# Patient Record
Sex: Male | Born: 1980 | Race: White | Hispanic: No | Marital: Single | Smoking: Former smoker
Health system: Southern US, Community
[De-identification: ages and names within clinical notes are randomized; demographics above are authoritative.]

---

## 2014-06-19 ENCOUNTER — Encounter: Payer: Self-pay | Admitting: Family Medicine

## 2014-06-19 ENCOUNTER — Ambulatory Visit (INDEPENDENT_AMBULATORY_CARE_PROVIDER_SITE_OTHER): Admitting: Family Medicine

## 2014-06-19 ENCOUNTER — Encounter (INDEPENDENT_AMBULATORY_CARE_PROVIDER_SITE_OTHER): Payer: Self-pay

## 2014-06-19 VITALS — BP 107/65 | HR 66 | Temp 97.3°F | Ht 70.0 in | Wt 165.0 lb

## 2014-06-19 DIAGNOSIS — Z720 Tobacco use: Secondary | ICD-10-CM

## 2014-06-19 DIAGNOSIS — M129 Arthropathy, unspecified: Secondary | ICD-10-CM | POA: Diagnosis not present

## 2014-06-19 DIAGNOSIS — F17200 Nicotine dependence, unspecified, uncomplicated: Secondary | ICD-10-CM

## 2014-06-19 DIAGNOSIS — M19019 Primary osteoarthritis, unspecified shoulder: Secondary | ICD-10-CM

## 2014-06-19 MED ORDER — DICLOFENAC SODIUM 75 MG PO TBEC: 75.0000 mg | DELAYED_RELEASE_TABLET | Freq: Two times a day (BID) | ORAL | Status: DC

## 2014-06-19 MED ORDER — VARENICLINE TARTRATE 0.5 MG X 11 & 1 MG X 42 PO MISC: ORAL | Status: DC

## 2014-06-19 NOTE — Progress Notes (Signed)
Subjective:    Patient ID: Bob Kennedy, male    DOB: 07/22/1980, 34 y.o.   MRN: [id number]  HPI Patient is here today for Right shoulder pain that started about 3-5 weeks ago  worsening for the last 3 days.  There is no know injury associated with this pain. Patient says it hurts primarily to raise his arm over his head. This is for abduction greater than approximately 60-75. There is no known injury. He does do a lot of lifting with regard to his work and exercise regimen. The pain is focused at the posterior aspect of the right deltoid. It is described as moderate. This would translate into approximately a 4-6/10. It interferes with activities that require lifting overhead greater than 5-10 pounds.        Review of Systems  Constitutional: Negative for fever, chills, diaphoresis and unexpected weight change.  HENT: Negative for congestion, hearing loss, rhinorrhea, sore throat and trouble swallowing.   Respiratory: Negative for cough, chest tightness, shortness of breath and wheezing.   Gastrointestinal: Negative for nausea, vomiting, abdominal pain, diarrhea, constipation and abdominal distention.  Endocrine: Negative for cold intolerance and heat intolerance.  Genitourinary: Negative for dysuria, hematuria and flank pain.  Musculoskeletal: Negative for joint swelling and arthralgias.  Skin: Negative for rash.  Neurological: Negative for dizziness and headaches.  Psychiatric/Behavioral: Negative for dysphoric mood, decreased concentration and agitation. The patient is not nervous/anxious.        Objective:   Physical Exam  Constitutional: He is oriented to person, place, and time. He appears well-developed and well-nourished. No distress.  HENT:  Head: Normocephalic and atraumatic.  Right Ear: External ear normal.  Left Ear: External ear normal.  Nose: Nose normal.  Mouth/Throat: Oropharynx is clear and moist.  Eyes: Conjunctivae and EOM are normal. Pupils are equal,  round, and reactive to light.  Neck: Normal range of motion. Neck supple. No thyromegaly present.  Cardiovascular: Normal rate, regular rhythm and normal heart sounds.   No murmur heard. Pulmonary/Chest: Effort normal and breath sounds normal. No respiratory distress. He has no wheezes. He has no rales.  Abdominal: Soft. Bowel sounds are normal. He exhibits no distension. There is no tenderness.  Musculoskeletal:       Right shoulder: He exhibits decreased range of motion and tenderness.       Arms: Lymphadenopathy:    He has no cervical adenopathy.  Neurological: He is alert and oriented to person, place, and time. He has normal reflexes.  Skin: Skin is warm and dry.  Psychiatric: He has a normal mood and affect. His behavior is normal. Judgment and thought content normal.   BP 107/65 mmHg  Pulse 66  Temp(Src) 97.3 F (36.3 C) (Oral)  Ht  (1.778 m)  Wt 165 lb (74.844 kg)  BMI 23.68 kg/m2        Assessment & Plan:   1. Arthropathy of shoulder region      ICD-9-CM ICD-10-CM   1. Arthropathy of shoulder region 716.91 M12.9 Ambulatory referral to Physical Therapy  2. Tobacco use disorder 305.1 Z72.0      Meds ordered this encounter  Medications  . Multiple Vitamin (MULTI VITAMIN DAILY PO)    Sig: Take 1 tablet by mouth daily.  . Omega-3 Fatty Acids (FISH OIL) 1000 MG CAPS    Sig: Take 1 capsule by mouth daily.  . Nutritional Supplements (PROTEIN SUPPLEMENT 80% PO)    Sig: Take 1 Dose by mouth 2 (two) times daily.  Marland Kitchen  varenicline (CHANTIX STARTING MONTH PAK) 0.5 MG X 11 & 1 MG X 42 tablet    Sig: Take one 0.5 mg tablet by mouth once daily for 3 days, then increase to one 0.5 mg tablet twice daily for 4 days, then increase to one 1 mg tablet twice daily.    Dispense:  53 tablet    Refill:  0  . diclofenac (VOLTAREN) 75 MG EC tablet    Sig: Take 1 tablet (75 mg total) by mouth 2 (two) times daily.    Dispense:  60 tablet    Refill:  2    Orders Placed This  Encounter  Procedures  . Ambulatory referral to Physical Therapy    Referral Priority:  Routine    Referral Type:  Physical Medicine    Referral Reason:  Specialty Services Required    Requested Specialty:  Physical Therapy    Number of Visits Requested:  1    Labs pending Health Maintenance reviewed Diet and exercise encouraged Continue all meds as discussed Follow up in 2 weeks if not better  Mechele ClaudeWarren Jude Linck, MD

## 2015-03-10 ENCOUNTER — Ambulatory Visit (INDEPENDENT_AMBULATORY_CARE_PROVIDER_SITE_OTHER): Admitting: Family Medicine

## 2015-03-10 ENCOUNTER — Encounter: Payer: Self-pay | Admitting: Family Medicine

## 2015-03-10 VITALS — BP 118/77 | HR 81 | Temp 97.9°F | Ht 70.0 in | Wt 163.2 lb

## 2015-03-10 DIAGNOSIS — J209 Acute bronchitis, unspecified: Secondary | ICD-10-CM | POA: Diagnosis not present

## 2015-03-10 MED ORDER — BENZONATATE 100 MG PO CAPS: 100.0000 mg | ORAL_CAPSULE | Freq: Two times a day (BID) | ORAL | Status: DC | PRN

## 2015-03-10 MED ORDER — TRIAMCINOLONE ACETONIDE 40 MG/ML IJ SUSP
40.0000 mg | Freq: Once | INTRAMUSCULAR | Status: AC
  Administered 2015-03-10: 40 mg via INTRAMUSCULAR

## 2015-03-10 MED ORDER — AZITHROMYCIN 250 MG PO TABS: ORAL_TABLET | ORAL | Status: DC

## 2015-03-10 NOTE — Addendum Note (Signed)
Addended by: Angela AdamOSTOSKY, JESSICA C on: 03/10/2015 09:43 AM   Modules accepted: Orders

## 2015-03-10 NOTE — Patient Instructions (Signed)
Great to meet you!  Come back if you do not get better as expected or get worse.   Acute Bronchitis Bronchitis is inflammation of the airways that extend from the windpipe into the lungs (bronchi). The inflammation often causes mucus to develop. This leads to a cough, which is the most common symptom of bronchitis.  In acute bronchitis, the condition usually develops suddenly and goes away over time, usually in a couple weeks. Smoking, allergies, and asthma can make bronchitis worse. Repeated episodes of bronchitis may cause further lung problems.  CAUSES Acute bronchitis is most often caused by the same virus that causes a cold. The virus can spread from person to person (contagious) through coughing, sneezing, and touching contaminated objects. SIGNS AND SYMPTOMS   Cough.   Fever.   Coughing up mucus.   Body aches.   Chest congestion.   Chills.   Shortness of breath.   Sore throat.  DIAGNOSIS  Acute bronchitis is usually diagnosed through a physical exam. Your health care provider will also ask you questions about your medical history. Tests, such as chest X-rays, are sometimes done to rule out other conditions.  TREATMENT  Acute bronchitis usually goes away in a couple weeks. Oftentimes, no medical treatment is necessary. Medicines are sometimes given for relief of fever or cough. Antibiotic medicines are usually not needed but may be prescribed in certain situations. In some cases, an inhaler may be recommended to help reduce shortness of breath and control the cough. A cool mist vaporizer may also be used to help thin bronchial secretions and make it easier to clear the chest.  HOME CARE INSTRUCTIONS  Get plenty of rest.   Drink enough fluids to keep your urine clear or pale yellow (unless you have a medical condition that requires fluid restriction). Increasing fluids may help thin your respiratory secretions (sputum) and reduce chest congestion, and it will prevent  dehydration.   Take medicines only as directed by your health care provider.  If you were prescribed an antibiotic medicine, finish it all even if you start to feel better.  Avoid smoking and secondhand smoke. Exposure to cigarette smoke or irritating chemicals will make bronchitis worse. If you are a smoker, consider using nicotine gum or skin patches to help control withdrawal symptoms. Quitting smoking will help your lungs heal faster.   Reduce the chances of another bout of acute bronchitis by washing your hands frequently, avoiding people with cold symptoms, and trying not to touch your hands to your mouth, nose, or eyes.   Keep all follow-up visits as directed by your health care provider.  SEEK MEDICAL CARE IF: Your symptoms do not improve after 1 week of treatment.  SEEK IMMEDIATE MEDICAL CARE IF:  You develop an increased fever or chills.   You have chest pain.   You have severe shortness of breath.  You have bloody sputum.   You develop dehydration.  You faint or repeatedly feel like you are going to pass out.  You develop repeated vomiting.  You develop a severe headache. MAKE SURE YOU:   Understand these instructions.  Will watch your condition.  Will get help right away if you are not doing well or get worse.   This information is not intended to replace advice given to you by your health care provider. Make sure you discuss any questions you have with your health care provider.   Document Released: 06/10/2004 Document Revised: 05/24/2014 Document Reviewed: 10/24/2012 Elsevier Interactive Patient Education 2016 Elsevier  Inc.  

## 2015-03-10 NOTE — Progress Notes (Signed)
   HPI  Patient presents today for evaluation of cough and cold.  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  He explains he said 3 days of nasal congestion, cough, headache, malaise, and subjective fever with chills at night. His shortness of breath with his cough. He does not have wheezing at baseline He is a current smoker.  He is having some shortness of breath but denies any difficulty breathing or increased work of breathing. He has normal oral intake  PMH: Smoking status noted ROS: Per HPI  Objective: BP 118/77 mmHg  Pulse 81  Temp(Src) 97.9 F (36.6 C) (Oral)  Ht 5\' 10"  (1.778 m)  Wt 163 lb 3.2 oz (74.027 kg)  BMI 23.42 kg/m2 Gen: NAD, alert, cooperative with exam HEENT: NCAT, nares clear bilaterally, TMs normal bilaterally, oropharynx clear Neck: No tender lymphadenopathy CV: RRR, good S1/S2, no murmur Resp: Nonlabored, expiratory wheezes throughout with some coarse added sounds as well Ext: No edema, warm Neuro: Alert and oriented, No gross deficits  Assessment and plan:  # Cough, acute bronchitis Treating aggressively like pneumonia Azithromycin, Tessalon, Kenalog Note given for work for 2-3 days   Meds ordered this encounter  Medications  . azithromycin (ZITHROMAX) 250 MG tablet    Sig: Take 2 tablets on day 1 and 1 tablet daily after that    Dispense:  6 tablet    Refill:  0  . benzonatate (TESSALON) 100 MG capsule    Sig: Take 1 capsule (100 mg total) by mouth 2 (two) times daily as needed for cough.    Dispense:  20 capsule    Refill:  0    Murtis SinkSam Alysson Geist, MD [facility] 03/10/2015, 8:21 AM

## 2016-03-15 ENCOUNTER — Ambulatory Visit (INDEPENDENT_AMBULATORY_CARE_PROVIDER_SITE_OTHER): Admitting: Nurse Practitioner

## 2016-03-15 ENCOUNTER — Encounter: Payer: Self-pay | Admitting: Nurse Practitioner

## 2016-03-15 VITALS — BP 108/73 | HR 89 | Temp 98.6°F | Ht 70.0 in | Wt 159.0 lb

## 2016-03-15 DIAGNOSIS — J0101 Acute recurrent maxillary sinusitis: Secondary | ICD-10-CM

## 2016-03-15 MED ORDER — AZITHROMYCIN 250 MG PO TABS: ORAL_TABLET | ORAL | 0 refills | Status: DC

## 2016-03-15 NOTE — Progress Notes (Signed)
Subjective:     Bob Kennedy is a 35 y.o. male who presents for evaluation of sinus pain. Symptoms include: congestion, facial pain, headaches and nasal congestion. Onset of symptoms was 2 days ago. Symptoms have been gradually worsening since that time. Past history is significant for no history of pneumonia or bronchitis. Patient is a non-smoker.  The following portions of the patient's history were reviewed and updated as appropriate: allergies, current medications, past family history, past medical history, past social history, past surgical history and problem list.  Review of Systems Pertinent items noted in HPI and remainder of comprehensive ROS otherwise negative.   Objective:    BP 108/73 (BP Location: Right Arm)   Pulse 89   Temp 98.6 F (37 C) (Oral)   Ht 5\' 10"  (1.778 m)   Wt 159 lb (72.1 kg)   BMI 22.81 kg/m  General appearance: alert and cooperative Eyes: conjunctivae/corneas clear. PERRL, EOM's intact. Fundi benign. Ears: normal TM's and external ear canals both ears Nose: clear discharge, moderate congestion, turbinates red, sinus tenderness bilateral Throat: lips, mucosa, and tongue normal; teeth and gums normal Neck: no adenopathy, no carotid bruit, no JVD, supple, symmetrical, trachea midline and thyroid not enlarged, symmetric, no tenderness/mass/nodules Lungs: clear to auscultation bilaterally Heart: regular rate and rhythm, S1, S2 normal, no murmur, click, rub or gallop    Assessment:    Acute bacterial sinusitis.    Plan:  1. Take meds as prescribed 2. Use a cool mist humidifier especially during the winter months and when heat has been humid. 3. Use saline nose sprays frequently 4. Saline irrigations of the nose can be very helpful if done frequently.  * 4X daily for 1 week*  * Use of a nettie pot can be helpful with this. Follow directions with this* 5. Drink plenty of fluids 6. Keep thermostat turn down low 7.For any cough or congestion  Use  plain Mucinex- regular strength or max strength is fine   * Children- consult with Pharmacist for dosing 8. For fever or aces or pains- take tylenol or ibuprofen appropriate for age and weight.  * for fevers greater than 101 orally you may alternate ibuprofen and tylenol every  3 hours.   Meds ordered this encounter  Medications  . azithromycin (ZITHROMAX Z-PAK) 250 MG tablet    Sig: As directed    Dispense:  6 tablet    Refill:  0    Order Specific Question:   Supervising Provider    Answer:   Johna SheriffVINCENT, CAROL L [[id number]]   Bob Daphine DeutscherMartin, FNP

## 2016-03-15 NOTE — Patient Instructions (Signed)

## 2018-03-17 ENCOUNTER — Ambulatory Visit (INDEPENDENT_AMBULATORY_CARE_PROVIDER_SITE_OTHER): Admitting: Pediatrics

## 2018-03-17 ENCOUNTER — Encounter: Payer: Self-pay | Admitting: Pediatrics

## 2018-03-17 ENCOUNTER — Other Ambulatory Visit: Payer: Self-pay

## 2018-03-17 ENCOUNTER — Telehealth: Payer: Self-pay | Admitting: Family Medicine

## 2018-03-17 VITALS — BP 113/73 | HR 85 | Temp 97.3°F | Ht 70.0 in | Wt 154.0 lb

## 2018-03-17 DIAGNOSIS — F4320 Adjustment disorder, unspecified: Secondary | ICD-10-CM

## 2018-03-17 DIAGNOSIS — R42 Dizziness and giddiness: Secondary | ICD-10-CM | POA: Diagnosis not present

## 2018-03-17 MED ORDER — HYDROXYZINE HCL 10 MG PO TABS: 10.0000 mg | ORAL_TABLET | Freq: Three times a day (TID) | ORAL | 2 refills | Status: DC | PRN

## 2018-03-17 MED ORDER — ESCITALOPRAM OXALATE 10 MG PO TABS: 10.0000 mg | ORAL_TABLET | Freq: Every day | ORAL | 5 refills | Status: DC

## 2018-03-17 NOTE — Progress Notes (Signed)
Subjective:   Patient ID: Bob Kennedy, male    DOB: 05-02-1981, 37 y.o.   MRN: [id number] CC: Funny feeling in his head HPI: Bob Kennedy is a 37 y.o. male   Intermittent episodes that can come out of the blue, feels like the top of his head feels funny, sometimes nervous, sometimes feels his heart racing after the episode started.  Heart palpitations never start the episode.  If he can take a few seconds, he is often able to calm himself back down.  Does not feel like the room is spinning.  Does not feel like he is going to pass out with the episodes.  He has noticed episodes happening more regularly.  High levels of ongoing stress. 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  Recent separation. Caring for 76 year old daughter. Feels like he is handling everything fine, performance at work remains good.  Used to be able to go for a run to help with stress.  Has had some off-and-on back problems he is getting worked up through the [place] that have prevented regular exercise routine.  Has been healthy, no major medical problems.  No history of lung problems, heart problems.  Has never had problems with his mood before.  Relevant past medical, surgical, family and social history reviewed. Allergies and medications reviewed and updated. Social History   Tobacco Use  Smoking Status Former Smoker  . Packs/day: 0.50  . Types: Cigarettes  . Start date: 06/20/2003  . Last attempt to quit: 11/14/2017  . Years since quitting: 0.3  Smokeless Tobacco Never Used   ROS: Per HPI   Objective:    BP 113/73   Pulse 85   Temp (!) 97.3 F (36.3 C) (Oral)   Ht '5\' 10"'  (1.778 m)   Wt 154 lb (69.9 kg)   BMI 22.10 kg/m   Wt Readings from Last 3 Encounters:  03/17/18 154 lb (69.9 kg)  03/15/16 159 lb (72.1 kg)  03/10/15 163 lb 3.2 oz (74 kg)    Gen: NAD, alert, cooperative with exam, NCAT EYES: EOMI, no conjunctival injection, or no icterus ENT:  TMs  pearly gray b/l, OP without erythema LYMPH: no cervical LAD CV: NRRR, normal S1/S2, no murmur, distal pulses 2+ b/l Resp: CTABL, no wheezes, normal WOB Abd: +BS, soft, NTND. no guarding or organomegaly Ext: No edema, warm Neuro: Alert and oriented, strength equal b/l UE and LE, coordination grossly normal MSK: normal muscle bulk  Assessment & Plan:  Brick was seen today for episodic episodes, likely related to anxious mood from recent life adjustments.  Diagnoses and all orders for this visit:  Adjustment disorder, unspecified type Blood pressure tends to be low, will do hydroxyzine as needed rather than propranolol.  Make sure does not cause sleepiness prior to taking during the day.  Also will start Lexapro.  Anticipate treating for 6 to 9 months prior to trying to wean off.  We will get blood work to make sure thyroid, kidney function, blood cell counts are normal.  Any side effects let me know. -     hydrOXYzine (ATARAX/VISTARIL) 10 MG tablet; Take 1 tablet (10 mg total) by mouth 3 (three) times daily as needed. -     escitalopram (LEXAPRO) 10 MG tablet; Take 1 tablet (10 mg total) by mouth daily.  Episodic lightheadedness -     CMP14+EGFR -     CBC with Differential/Platelet -     TSH  I  spent 25 minutes with the patient with over 50% of the encounter time dedicated to counseling on the above problems.   Follow up plan: Return in about 6 weeks (around 04/28/2018). Assunta Found, MD [place]

## 2018-03-17 NOTE — Telephone Encounter (Signed)
Rxs sent to [facility] in [place] per pt request Cancelled rxs at [facility]

## 2018-03-18 LAB — CBC WITH DIFFERENTIAL/PLATELET
Basophils Absolute: 0.1 x10E3/uL (ref 0.0–0.2)
Basos: 1 %
EOS (ABSOLUTE): 0.1 x10E3/uL (ref 0.0–0.4)
Eos: 3 %
Hematocrit: 45.7 % (ref 37.5–51.0)
Hemoglobin: 16.2 g/dL (ref 13.0–17.7)
Immature Grans (Abs): 0 x10E3/uL (ref 0.0–0.1)
Immature Granulocytes: 0 %
Lymphocytes Absolute: 1.5 x10E3/uL (ref 0.7–3.1)
Lymphs: 28 %
MCH: 30.7 pg (ref 26.6–33.0)
MCHC: 35.4 g/dL (ref 31.5–35.7)
MCV: 87 fL (ref 79–97)
Monocytes Absolute: 0.5 x10E3/uL (ref 0.1–0.9)
Monocytes: 9 %
Neutrophils Absolute: 3.1 x10E3/uL (ref 1.4–7.0)
Neutrophils: 59 %
Platelets: 263 x10E3/uL (ref 150–450)
RBC: 5.28 x10E6/uL (ref 4.14–5.80)
RDW: 12.2 % — ABNORMAL LOW (ref 12.3–15.4)
WBC: 5.3 x10E3/uL (ref 3.4–10.8)

## 2018-03-18 LAB — CMP14+EGFR
ALK PHOS: 64 IU/L (ref 39–117)
ALT: 12 IU/L (ref 0–44)
AST: 13 IU/L (ref 0–40)
Albumin/Globulin Ratio: 2.3 — ABNORMAL HIGH (ref 1.2–2.2)
Albumin: 5 g/dL (ref 3.5–5.5)
BUN/Creatinine Ratio: 8 — ABNORMAL LOW (ref 9–20)
BUN: 9 mg/dL (ref 6–20)
Bilirubin Total: 1.5 mg/dL — ABNORMAL HIGH (ref 0.0–1.2)
CHLORIDE: 100 mmol/L (ref 96–106)
CO2: 25 mmol/L (ref 20–29)
CREATININE: 1.07 mg/dL (ref 0.76–1.27)
Calcium: 9.8 mg/dL (ref 8.7–10.2)
GFR calc Af Amer: 102 mL/min/{1.73_m2} (ref >59)
GFR calc non Af Amer: 88 mL/min/{1.73_m2} (ref >59)
GLUCOSE: 91 mg/dL (ref 65–99)
Globulin, Total: 2.2 g/dL (ref 1.5–4.5)
Potassium: 4.4 mmol/L (ref 3.5–5.2)
Sodium: 140 mmol/L (ref 134–144)
Total Protein: 7.2 g/dL (ref 6.0–8.5)

## 2018-03-18 LAB — TSH: TSH: 1.65 u[IU]/mL (ref 0.450–4.500)

## 2018-04-28 ENCOUNTER — Ambulatory Visit: Admitting: Pediatrics

## 2018-05-26 ENCOUNTER — Ambulatory Visit (INDEPENDENT_AMBULATORY_CARE_PROVIDER_SITE_OTHER): Admitting: Pediatrics

## 2018-05-26 ENCOUNTER — Encounter: Payer: Self-pay | Admitting: Pediatrics

## 2018-05-26 DIAGNOSIS — F4320 Adjustment disorder, unspecified: Secondary | ICD-10-CM

## 2018-05-26 MED ORDER — ESCITALOPRAM OXALATE 10 MG PO TABS: 10.0000 mg | ORAL_TABLET | Freq: Every day | ORAL | 3 refills | Status: DC

## 2018-05-26 NOTE — Progress Notes (Signed)
  Subjective:   Patient ID: Bob Kennedy, male    DOB: Jan 25, 1981, 38 y.o.   MRN: [id number] CC: Medical Management of Chronic Issues  HPI: Bob Kennedy is a 38 y.o. 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  Started on Lexapro. He thinks overall things are better. Has has not had to pull the car over or otherwise interrupt his usual activities because of symptoms.   He has been taking the Lexapro regularly.  He does resilience training at work several times a year, is familiar with CBT techniques.  Has only had to take hydroxyzine a couple times.  Depression screen [facility] 2/9 05/26/2018 03/17/2018 03/15/2016 03/10/2015 06/19/2014  Decreased Interest 1 0 0 0 0  Down, Depressed, Hopeless 1 1 1  0 0  PHQ - 2 Score 2 1 1  0 0  Altered sleeping 1 - - - -  Tired, decreased energy 1 - - - -  Change in appetite 1 - - - -  Feeling bad or failure about yourself  1 - - - -  Trouble concentrating 0 - - - -  Moving slowly or fidgety/restless 1 - - - -  Suicidal thoughts 0 - - - -  PHQ-9 Score 7 - - - -  Difficult doing work/chores Somewhat difficult - - - -   Relevant past medical, surgical, family and social history reviewed. Allergies and medications reviewed and updated. Social History   Tobacco Use  Smoking Status Former Smoker  . Packs/day: 0.50  . Types: Cigarettes  . Start date: 06/20/2003  . Last attempt to quit: 11/14/2017  . Years since quitting: 0.5  Smokeless Tobacco Never Used   ROS: Per HPI   Objective:    BP 109/65   Pulse 87   Temp 97.6 F (36.4 C) (Oral)   Ht 5\' 10"  (1.778 m)   Wt 161 lb 3.2 oz (73.1 kg)   BMI 23.13 kg/m   Wt Readings from Last 3 Encounters:  05/26/18 161 lb 3.2 oz (73.1 kg)  03/17/18 154 lb (69.9 kg)  03/15/16 159 lb (72.1 kg)    Gen: NAD, alert, cooperative with exam, NCAT EYES: EOMI, no conjunctival injection, or no icterus ENT:  TMs pearly gray b/l, OP without  erythema LYMPH: no cervical LAD CV: NRRR, normal S1/S2, no murmur, distal pulses 2+ b/l Resp: CTABL, no wheezes, normal WOB Abd: +BS, soft, NTND. no guarding or organomegaly Ext: No edema, warm Neuro: Alert and oriented  Assessment & Plan:  Bob Kennedy was seen today for medical management of chronic issues.  Diagnoses and all orders for this visit:  Adjustment disorder, unspecified type Stable on below, continue -     escitalopram (LEXAPRO) 10 MG tablet; Take 1 tablet (10 mg total) by mouth daily.   Follow up plan: Return in about 1 year (around 05/27/2019). Rex Kras, MD [facility]

## 2018-12-12 ENCOUNTER — Telehealth: Payer: Self-pay | Admitting: Family Medicine

## 2018-12-12 NOTE — Telephone Encounter (Signed)
Offered appt with [facility] next week, pt declined. 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 No appts today, he will try urgent care

## 2018-12-15 ENCOUNTER — Emergency Department (INDEPENDENT_AMBULATORY_CARE_PROVIDER_SITE_OTHER)

## 2018-12-15 ENCOUNTER — Other Ambulatory Visit: Payer: Self-pay

## 2018-12-15 ENCOUNTER — Emergency Department (INDEPENDENT_AMBULATORY_CARE_PROVIDER_SITE_OTHER)
Admission: EM | Admit: 2018-12-15 | Discharge: 2018-12-15 | Disposition: A | Discharge status: Home / Self Care | Source: Home / Self Care | Attending: Family Medicine | Admitting: Family Medicine

## 2018-12-15 ENCOUNTER — Encounter: Payer: Self-pay | Admitting: Emergency Medicine

## 2018-12-15 DIAGNOSIS — G8929 Other chronic pain: Secondary | ICD-10-CM | POA: Diagnosis not present

## 2018-12-15 DIAGNOSIS — M545 Low back pain: Secondary | ICD-10-CM

## 2018-12-15 DIAGNOSIS — M5137 Other intervertebral disc degeneration, lumbosacral region: Secondary | ICD-10-CM | POA: Diagnosis not present

## 2018-12-15 NOTE — Discharge Instructions (Signed)
Apply ice pack for 20 to 30 minutes, 2 to 3 times daily  Continue until pain decreases.  Take Ibuprofen 200mg , 4 tabs every 8 hours with food.

## 2018-12-15 NOTE — ED Triage Notes (Signed)
Low Back pain x 1 year, mostly right side, worse when bending over

## 2018-12-19 NOTE — ED Provider Notes (Signed)
[facility]    CSN: [id number] Arrival date & time: 12/15/18  1256      History   Chief Complaint Chief Complaint  Patient presents with  . Back Pain    HPI Bob Kennedy is a 38 y.o. male.   Patient complains of chronic non-radiating right low back pain for about a year, worse during the past week.  He denies recent injury.   He denies bowel or bladder dysfunction, and no saddle numbness.  The pain is worse when he bends laterally to the right, worse with sudden movement, and when walking down stairs.  The pain is also worse each morning.  He states that in the past the pain has always resolved in a day or two.  The history is provided by the patient.  Back Pain Location:  Lumbar spine Quality:  Aching Radiates to:  Does not radiate Pain severity:  Moderate Worse during: worse in the morning. Onset quality:  Gradual Duration:  1 week Timing:  Constant Progression:  Unchanged Chronicity:  Chronic Context: lifting heavy objects   Context: not MVA, not occupational injury, not physical stress, not recent illness and not recent injury   Relieved by:  Being still and OTC medications Worsened by:  Bending Ineffective treatments:  Being still Associated symptoms: no abdominal pain, no abdominal swelling, no bladder incontinence, no bowel incontinence, no chest pain, no dysuria, no fever, no leg pain, no numbness, no paresthesias, no perianal numbness, no tingling, no weakness and no weight loss     History reviewed. No pertinent past medical history.  Patient Active Problem List   Diagnosis Date Noted  . Acute bronchitis 03/10/2015    History reviewed. No pertinent surgical history.     Home Medications    Prior to Admission medications   Medication Sig Start Date End Date Taking? Authorizing Provider  escitalopram (LEXAPRO) 10 MG tablet Take 1 tablet (10 mg total) by mouth daily. 05/26/18   Johna SheriffVincent, Carol L, MD  hydrOXYzine (ATARAX/VISTARIL) 10 MG  tablet Take 1 tablet (10 mg total) by mouth 3 (three) times daily as needed. 03/17/18   Johna SheriffVincent, Carol L, MD    Family History Family History  Problem Relation Age of Onset  . Diabetes Mother   . Hypertension Father     Social History Social History   Tobacco Use  . Smoking status: Former Smoker    Packs/day: 0.50    Types: Cigarettes    Start date: 06/20/2003    Quit date: 11/14/2017    Years since quitting: 1.0  . Smokeless tobacco: Never Used  Substance Use Topics  . Alcohol use: Yes    Alcohol/week: 0.0 standard drinks    Comment: rarely  . Drug use: No     Allergies   Patient has no known allergies.   Review of Systems Review of Systems  Constitutional: Negative for fever and weight loss.  Cardiovascular: Negative for chest pain.  Gastrointestinal: Negative for abdominal pain and bowel incontinence.  Genitourinary: Negative for bladder incontinence and dysuria.  Musculoskeletal: Positive for back pain.  Neurological: Negative for tingling, weakness, numbness and paresthesias.  All other systems reviewed and are negative.    Physical Exam Triage Vital Signs ED Triage Vitals  Enc Vitals Group     BP 12/15/18 1418 101/69     Pulse Rate 12/15/18 1418 74     Resp --      Temp 12/15/18 1418 98.4 F (36.9 C)     Temp Source  12/15/18 1418 Oral     SpO2 --      Weight 12/15/18 1419 155 lb (70.3 kg)     Height 12/15/18 1419 5\' 10"  (1.778 m)     Head Circumference --      Peak Flow --      Pain Score 12/15/18 1419 8     Pain Loc --      Pain Edu? --      Excl. in [place]? --    No data found.  Updated Vital Signs BP 101/69 (BP Location: Right Arm)   Pulse 74   Temp 98.4 F (36.9 C) (Oral)   Ht 5\' 10"  (1.778 m)   Wt 70.3 kg   BMI 22.24 kg/m   Visual Acuity Right Eye Distance:   Left Eye Distance:   Bilateral Distance:    Right Eye Near:   Left Eye Near:    Bilateral Near:     Physical Exam Vitals signs and nursing note reviewed.  Constitutional:       General: He is not in acute distress. HENT:     Head: Normocephalic.  Eyes:     Pupils: Pupils are equal, round, and reactive to light.  Neck:     Musculoskeletal: Normal range of motion. No muscular tenderness.  Cardiovascular:     Rate and Rhythm: Normal rate.     Heart sounds: Normal heart sounds.  Pulmonary:     Breath sounds: Normal breath sounds.  Abdominal:     Tenderness: There is no abdominal tenderness.  Musculoskeletal:       Back:     Right lower leg: No edema.     Left lower leg: No edema.     Comments: Back:  Range of motion relatively well preserved.  Can heel/toe walk and squat without difficulty.  Tenderness in the midline and right paraspinous muscles from L3 to Sacral area.  Straight leg raising test is negative.  Sitting knee extension test is negative.  Strength and sensation in the lower extremities is normal.  Patellar and achilles reflexes are normal   Skin:    General: Skin is warm and dry.     Findings: No rash.  Neurological:     Mental Status: He is alert.      UC Treatments / Results  Labs (all labs ordered are listed, but only abnormal results are displayed) Labs Reviewed - No data to display  EKG   Radiology CLINICAL DATA:  Chronic right-sided low back pain for 1 year. No acute injury or prior relevant surgery.  EXAM: LUMBAR SPINE - COMPLETE 4+ VIEW  COMPARISON:  None.  FINDINGS: There are 5 lumbar type vertebral bodies. There is mild disc space narrowing at L5-S1 and 3 mm of retrolisthesis. The additional disc spaces are preserved. There is no evidence of acute fracture or pars defect.  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 No acute osseous findings.   Electronically Signed   By: Richardean Sale M.D.   On: 12/15/2018 14:56  Procedures Procedures (including critical care time)  Medications Ordered in UC Medications - No data to display  Initial Impression /  Assessment and Plan / UC Course  I have reviewed the triage vital signs and the nursing notes.  Pertinent labs & imaging results that were available during my care of the patient were reviewed by me and considered in my medical decision making (see chart for details).    No evidence acute injury. Followup  with Dr. Rodney Langtonhomas Thekkekandam or Dr. Clementeen GrahamEvan Corey ([facility]) if not improving about two weeks.  Begin back range of motion and stretching exercises.  Final Clinical Impressions(s) / UC Diagnoses   Final diagnoses:  Chronic right-sided low back pain without sciatica     Discharge Instructions     Apply ice pack for 20 to 30 minutes, 2 to 3 times daily  Continue until pain decreases.  Take Ibuprofen 200mg , 4 tabs every 8 hours with food.    ED Prescriptions    None        Lattie HawBeese,  A, MD 12/19/18 [id number]

## 2019-09-20 ENCOUNTER — Other Ambulatory Visit: Payer: Self-pay | Admitting: *Deleted

## 2019-09-20 DIAGNOSIS — F4320 Adjustment disorder, unspecified: Secondary | ICD-10-CM

## 2019-09-20 MED ORDER — ESCITALOPRAM OXALATE 10 MG PO TABS: 10.0000 mg | ORAL_TABLET | Freq: Every day | ORAL | 0 refills | Status: DC

## 2019-10-13 ENCOUNTER — Other Ambulatory Visit: Payer: Self-pay | Admitting: Family Medicine

## 2019-10-13 DIAGNOSIS — F4320 Adjustment disorder, unspecified: Secondary | ICD-10-CM

## 2019-10-14 NOTE — Telephone Encounter (Signed)
**  [facility] After Hours/ Emergency Line Call**  Patient: Bob Kennedy.  PCP: Mechele Claude, MD  Patient going for 2 week military training.  Needing refill on Lexapro.  Has not been seen since 05/2018.  Patient willing to schedule appt but needs bridge supply.  Medication phoned to [phone].  Must have OV for further fills.   Delesha Pohlman M. Nadine Counts, DO

## 2019-10-16 MED ORDER — ESCITALOPRAM OXALATE 10 MG PO TABS: ORAL_TABLET | ORAL | 0 refills | Status: DC

## 2019-10-16 NOTE — Addendum Note (Signed)
Addended by: Magdalene River on: 10/16/2019 09:43 AM   Modules accepted: Orders

## 2019-12-24 ENCOUNTER — Ambulatory Visit (INDEPENDENT_AMBULATORY_CARE_PROVIDER_SITE_OTHER): Admitting: Family Medicine

## 2019-12-24 ENCOUNTER — Other Ambulatory Visit: Payer: Self-pay

## 2019-12-24 DIAGNOSIS — K219 Gastro-esophageal reflux disease without esophagitis: Secondary | ICD-10-CM

## 2019-12-24 DIAGNOSIS — J069 Acute upper respiratory infection, unspecified: Secondary | ICD-10-CM

## 2019-12-24 DIAGNOSIS — F4320 Adjustment disorder, unspecified: Secondary | ICD-10-CM

## 2019-12-24 MED ORDER — BENZONATATE 100 MG PO CAPS: 100.0000 mg | ORAL_CAPSULE | Freq: Three times a day (TID) | ORAL | 0 refills | Status: DC | PRN

## 2019-12-24 MED ORDER — PANTOPRAZOLE SODIUM 40 MG PO TBEC: 40.0000 mg | DELAYED_RELEASE_TABLET | Freq: Every day | ORAL | 0 refills | Status: DC

## 2019-12-24 MED ORDER — BUPROPION HCL ER (XL) 150 MG PO TB24: 150.0000 mg | ORAL_TABLET | Freq: Every day | ORAL | 1 refills | Status: DC

## 2019-12-24 MED ORDER — HYDROXYZINE HCL 10 MG PO TABS: 10.0000 mg | ORAL_TABLET | Freq: Three times a day (TID) | ORAL | 2 refills | Status: DC | PRN

## 2019-12-24 NOTE — Progress Notes (Signed)
Telephone visit  Subjective: CC: URI PCP: Mechele Claude, MD EPP:IRJJOACZYSA Bob Kennedy is a 39 y.o. male calls for telephone consult today. Patient provides verbal consent for consult held via phone.  Due to COVID-19 pandemic this visit was conducted virtually. This visit type was conducted due to national recommendations for restrictions regarding the COVID-19 Pandemic (e.g. social distancing, sheltering in place) in an effort to limit this patient's exposure and mitigate transmission in our community. All issues noted in this document were discussed and addressed.  A physical exam was not performed with this format.   Location of patient: home Location of provider: [facility] Others present for call: none  1. URI Developed a low grade fever on Friday evening.  He reports arthralgia, headache and sinus pressure.  He reports fatigue.  He reports stomach felt like he had an ulcer as well.  He has history of gastric ulcer. He tested for COVID and was negative.  He is not vaccinated against COVID yet. No known tick bites.  He has started smoking again.  2. GAD/ panic Patient reports he has regained his weight since coming back from [place].  He does report improvement in panic/ dizziness since being on the Lexapro.  He does feel the medication impacts his libido and causes ED.  He feels like he is working himself to death.  His marriage is impacted as well.  He has been drinking more coffee and skipping meals which he thinks is also contributing to his stomach issues (which are typically well controlled with PRN Tums).   ROS: Per HPI  No Known Allergies No past medical history on file.  Current Outpatient Medications:  .  escitalopram (LEXAPRO) 10 MG tablet, TAKE 1 TABLET BY MOUTH ONCE DAILY . APPOINTMENT REQUIRED FOR FUTURE REFILLS, Disp: 90 tablet, Rfl: 0 .  hydrOXYzine (ATARAX/VISTARIL) 10 MG tablet, Take 1 tablet (10 mg total) by mouth 3 (three) times daily as needed., Disp: 30 tablet, Rfl:  2  Assessment/ Plan: 39 y.o. male   1. Adjustment disorder, unspecified type Start wellbutrin. Continue lexapro.  Follow up in 4 weeks for recheck - buPROPion (WELLBUTRIN XL) 150 MG 24 hr tablet; Take 1 tablet (150 mg total) by mouth daily.  Dispense: 30 tablet; Refill: 1 - hydrOXYzine (ATARAX/VISTARIL) 10 MG tablet; Take 1 tablet (10 mg total) by mouth 3 (three) times daily as needed.  Dispense: 30 tablet; Refill: 2  2. Gastroesophageal reflux disease without esophagitis Start PPI. Avoid NSAIDs, reduce acid if possible. - pantoprazole (PROTONIX) 40 MG tablet; Take 1 tablet (40 mg total) by mouth daily.  Dispense: 30 tablet; Refill: 0  3. Viral URI with cough Supportive care, Tessalon perles.  Hydration, tylenol, rest. - benzonatate (TESSALON PERLES) 100 MG capsule; Take 1 capsule (100 mg total) by mouth 3 (three) times daily as needed.  Dispense: 20 capsule; Refill: 0   Start time: 1:09pm End time: 1:35pm  Total time spent on patient care (including telephone call/ virtual visit):  Bob Kennedy Hulen Skains, DO [facility] [phone]

## 2019-12-24 NOTE — Patient Instructions (Signed)

## 2020-01-16 ENCOUNTER — Encounter: Admitting: Family Medicine

## 2020-01-23 ENCOUNTER — Other Ambulatory Visit: Payer: Self-pay

## 2020-01-23 ENCOUNTER — Ambulatory Visit (INDEPENDENT_AMBULATORY_CARE_PROVIDER_SITE_OTHER): Admitting: Family Medicine

## 2020-01-23 ENCOUNTER — Telehealth: Payer: Self-pay | Admitting: Family Medicine

## 2020-01-23 ENCOUNTER — Encounter: Payer: Self-pay | Admitting: Family Medicine

## 2020-01-23 VITALS — BP 115/72 | HR 60 | Temp 98.0°F | Ht 70.0 in | Wt 174.6 lb

## 2020-01-23 DIAGNOSIS — Z114 Encounter for screening for human immunodeficiency virus [HIV]: Secondary | ICD-10-CM

## 2020-01-23 DIAGNOSIS — N521 Erectile dysfunction due to diseases classified elsewhere: Secondary | ICD-10-CM

## 2020-01-23 DIAGNOSIS — K219 Gastro-esophageal reflux disease without esophagitis: Secondary | ICD-10-CM

## 2020-01-23 DIAGNOSIS — Z0001 Encounter for general adult medical examination with abnormal findings: Secondary | ICD-10-CM | POA: Diagnosis not present

## 2020-01-23 DIAGNOSIS — F4320 Adjustment disorder, unspecified: Secondary | ICD-10-CM

## 2020-01-23 DIAGNOSIS — R6882 Decreased libido: Secondary | ICD-10-CM

## 2020-01-23 DIAGNOSIS — F411 Generalized anxiety disorder: Secondary | ICD-10-CM

## 2020-01-23 DIAGNOSIS — Z1159 Encounter for screening for other viral diseases: Secondary | ICD-10-CM

## 2020-01-23 DIAGNOSIS — Z0000 Encounter for general adult medical examination without abnormal findings: Secondary | ICD-10-CM

## 2020-01-23 MED ORDER — BUSPIRONE HCL 10 MG PO TABS: 10.0000 mg | ORAL_TABLET | Freq: Three times a day (TID) | ORAL | 1 refills | Status: DC

## 2020-01-23 MED ORDER — TADALAFIL 5 MG PO TABS: 5.0000 mg | ORAL_TABLET | Freq: Every day | ORAL | 1 refills | Status: DC | PRN

## 2020-01-23 MED ORDER — PANTOPRAZOLE SODIUM 40 MG PO TBEC: 40.0000 mg | DELAYED_RELEASE_TABLET | Freq: Every day | ORAL | 1 refills | Status: DC

## 2020-01-23 NOTE — Telephone Encounter (Signed)
Pt called stating that we sent his Rx's to wrong pharmacy. Pt needs them sent to [facility] in [place].

## 2020-01-23 NOTE — Progress Notes (Signed)
Subjective:  Patient ID: Bob Kennedy, male    DOB: 17-May-1981  Age: 39 y.o. MRN: [id number]  CC: No chief complaint on file.   HPI Lawayne Hartig presents for complete physical. 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 He is also a Designer, fashion/clothing. He feels that too much is being filed on him he is maximally stressed and he is planning to retire within the next year. Patient states that he is having issues with anxiety and needs treatment for that. Additionally he is experiencing low libido and erectile dysfunction.  Depression screen [facility] 2/9 01/23/2020 05/26/2018 03/17/2018  Decreased Interest 0 1 0  Down, Depressed, Hopeless 0 1 1  PHQ - 2 Score 0 2 1  Altered sleeping - 1 -  Tired, decreased energy - 1 -  Change in appetite - 1 -  Feeling bad or failure about yourself  - 1 -  Trouble concentrating - 0 -  Moving slowly or fidgety/restless - 1 -  Suicidal thoughts - 0 -  PHQ-9 Score - 7 -  Difficult doing work/chores - Somewhat difficult -    History Valentin has no past medical history on file.   He has no past surgical history on file.   His family history includes Diabetes in his mother; Hypertension in his father.He reports that he quit smoking about 2 years ago. His smoking use included cigarettes. He started smoking about 16 years ago. He smoked 0.50 packs per day. He has never used smokeless tobacco. He reports current alcohol use. He reports that he does not use drugs.    ROS Review of Systems  Constitutional: Negative for activity change, fatigue and unexpected weight change.  HENT: Negative for congestion, ear pain, hearing loss, postnasal drip and trouble swallowing.   Eyes: Negative for pain and visual disturbance.  Respiratory: Negative for cough, chest tightness and shortness of breath.   Cardiovascular: Negative for chest pain, palpitations and leg swelling.  Gastrointestinal: Negative  for abdominal distention, abdominal pain, blood in stool, constipation, diarrhea, nausea and vomiting.  Endocrine: Negative for cold intolerance, heat intolerance and polydipsia.  Genitourinary: Negative for difficulty urinating, dysuria, flank pain, frequency and urgency.  Musculoskeletal: Negative for arthralgias and joint swelling.  Skin: Negative for color change, rash and wound.  Neurological: Negative for dizziness, syncope, speech difficulty, weakness, light-headedness, numbness and headaches.  Hematological: Does not bruise/bleed easily.  Psychiatric/Behavioral: Negative for confusion, decreased concentration, dysphoric mood and sleep disturbance. The patient is not nervous/anxious.     Objective:  BP 115/72   Pulse 60   Temp 98 F (36.7 C) (Temporal)   Ht _0  (1.778 m)   Wt 174 lb 9.6 oz (79.2 kg)   BMI 25.05 kg/m   BP Readings from Last 3 Encounters:  01/23/20 115/72  12/15/18 101/69  05/26/18 109/65    Wt Readings from Last 3 Encounters:  01/23/20 174 lb 9.6 oz (79.2 kg)  12/15/18 155 lb (70.3 kg)  05/26/18 161 lb 3.2 oz (73.1 kg)     Physical Exam Constitutional:      Appearance: He is well-developed.  HENT:     Head: Normocephalic and atraumatic.  Eyes:     Pupils: Pupils are equal, round, and reactive to light.  Neck:     Thyroid: No thyromegaly.     Trachea: No tracheal deviation.  Cardiovascular:     Rate and Rhythm: Normal rate and regular rhythm.  Heart sounds: Normal heart sounds. No murmur heard.  No friction rub. No gallop.   Pulmonary:     Breath sounds: Normal breath sounds. No wheezing or rales.  Abdominal:     General: Bowel sounds are normal. There is no distension.     Palpations: Abdomen is soft. There is no mass.     Tenderness: There is no abdominal tenderness.     Hernia: There is no hernia in the left inguinal area.  Genitourinary:    Penis: Normal.      Testes: Normal.  Musculoskeletal:        General: Normal range of  motion.     Cervical back: Normal range of motion.  Lymphadenopathy:     Cervical: No cervical adenopathy.  Skin:    General: Skin is warm and dry.  Neurological:     Mental Status: He is alert and oriented to person, place, and time.       Assessment & Plan:   Diagnoses and all orders for this visit:  Well adult exam -     CBC with Differential/Platelet -     CMP14+EGFR -     Lipid panel -     Cancel: Urinalysis  Adjustment disorder, unspecified type -     CBC with Differential/Platelet -     CMP14+EGFR  Gastroesophageal reflux disease without esophagitis -     CBC with Differential/Platelet -     CMP14+EGFR -     Discontinue: pantoprazole (PROTONIX) 40 MG tablet; Take 1 tablet (40 mg total) by mouth daily.  Loss of libido -     CBC with Differential/Platelet -     CMP14+EGFR  Erectile dysfunction due to diseases classified elsewhere -     CBC with Differential/Platelet -     CMP14+EGFR  Need for hepatitis C screening test -     CBC with Differential/Platelet -     CMP14+EGFR -     Cancel: Hepatitis C antibody  Encounter for screening for HIV -     CBC with Differential/Platelet -     CMP14+EGFR -     Cancel: HIV Antibody (routine testing w rflx)  GAD (generalized anxiety disorder) -     CBC with Differential/Platelet -     CMP14+EGFR  Other orders -     Discontinue: tadalafil (CIALIS) 5 MG tablet; Take 1 tablet (5 mg total) by mouth daily as needed for erectile dysfunction. -     Discontinue: busPIRone (BUSPAR) 10 MG tablet; Take 1 tablet (10 mg total) by mouth 3 (three) times daily. For anxiety       I have discontinued Tage Delaguila's escitalopram, buPROPion, hydrOXYzine, benzonatate, and pantoprazole.  Allergies as of 01/23/2020   No Known Allergies     Medication List       Accurate as of January 23, 2020 11:59 PM. If you have any questions, ask your nurse or doctor.        STOP taking these medications   benzonatate 100 MG  capsule Commonly known as: Best boy Stopped by: Claretta Fraise, MD   buPROPion 150 MG 24 hr tablet Commonly known as: Wellbutrin XL Stopped by: Claretta Fraise, MD   escitalopram 10 MG tablet Commonly known as: LEXAPRO Stopped by: Claretta Fraise, MD   hydrOXYzine 10 MG tablet Commonly known as: ATARAX/VISTARIL Stopped by: Claretta Fraise, MD     TAKE these medications   busPIRone 10 MG tablet Commonly known as: BUSPAR Take 1 tablet (10 mg total) by mouth  3 (three) times daily. For anxiety Started by: Claretta Fraise, MD   pantoprazole 40 MG tablet Commonly known as: PROTONIX Take 1 tablet (40 mg total) by mouth daily.   tadalafil 5 MG tablet Commonly known as: CIALIS Take 1 tablet (5 mg total) by mouth daily as needed for erectile dysfunction. Started by: Claretta Fraise, MD        Follow-up: Return in about 6 weeks (around 03/05/2020).  Claretta Fraise, M.D.

## 2020-01-23 NOTE — Telephone Encounter (Signed)
Prescriptions sent to pharmacy

## 2020-01-24 LAB — CMP14+EGFR
ALT: 16 IU/L (ref 0–44)
AST: 15 IU/L (ref 0–40)
Albumin/Globulin Ratio: 2.6 — ABNORMAL HIGH (ref 1.2–2.2)
Albumin: 4.9 g/dL (ref 4.0–5.0)
Alkaline Phosphatase: 77 IU/L (ref 48–121)
BUN/Creatinine Ratio: 11 (ref 9–20)
BUN: 11 mg/dL (ref 6–20)
Bilirubin Total: 0.8 mg/dL (ref 0.0–1.2)
CO2: 25 mmol/L (ref 20–29)
Calcium: 9.5 mg/dL (ref 8.7–10.2)
Chloride: 102 mmol/L (ref 96–106)
Creatinine, Ser: 1 mg/dL (ref 0.76–1.27)
GFR calc Af Amer: 109 mL/min/{1.73_m2} (ref >59)
GFR calc non Af Amer: 94 mL/min/{1.73_m2} (ref >59)
Globulin, Total: 1.9 g/dL (ref 1.5–4.5)
Glucose: 86 mg/dL (ref 65–99)
Potassium: 5.1 mmol/L (ref 3.5–5.2)
Sodium: 138 mmol/L (ref 134–144)
Total Protein: 6.8 g/dL (ref 6.0–8.5)

## 2020-01-24 LAB — CBC WITH DIFFERENTIAL/PLATELET
Basophils Absolute: 0.1 10*3/uL (ref 0.0–0.2)
Basos: 1 %
EOS (ABSOLUTE): 0.3 10*3/uL (ref 0.0–0.4)
Eos: 4 %
Hematocrit: 47.2 % (ref 37.5–51.0)
Hemoglobin: 16.2 g/dL (ref 13.0–17.7)
Immature Grans (Abs): 0 10*3/uL (ref 0.0–0.1)
Immature Granulocytes: 0 %
Lymphocytes Absolute: 1.8 10*3/uL (ref 0.7–3.1)
Lymphs: 24 %
MCH: 31.6 pg (ref 26.6–33.0)
MCHC: 34.3 g/dL (ref 31.5–35.7)
MCV: 92 fL (ref 79–97)
Monocytes Absolute: 0.6 10*3/uL (ref 0.1–0.9)
Monocytes: 8 %
Neutrophils Absolute: 4.7 10*3/uL (ref 1.4–7.0)
Neutrophils: 63 %
Platelets: 256 10*3/uL (ref 150–450)
RBC: 5.13 x10E6/uL (ref 4.14–5.80)
RDW: 12.6 % (ref 11.6–15.4)
WBC: 7.4 10*3/uL (ref 3.4–10.8)

## 2020-01-24 LAB — LIPID PANEL
Chol/HDL Ratio: 4.1 ratio (ref 0.0–5.0)
Cholesterol, Total: 219 mg/dL — ABNORMAL HIGH (ref 100–199)
HDL: 53 mg/dL (ref >39)
LDL Chol Calc (NIH): 150 mg/dL — ABNORMAL HIGH (ref 0–99)
Triglycerides: 91 mg/dL (ref 0–149)
VLDL Cholesterol Cal: 16 mg/dL (ref 5–40)

## 2020-01-29 ENCOUNTER — Encounter: Payer: Self-pay | Admitting: Family Medicine

## 2020-02-20 ENCOUNTER — Ambulatory Visit (INDEPENDENT_AMBULATORY_CARE_PROVIDER_SITE_OTHER): Admitting: Family Medicine

## 2020-02-20 ENCOUNTER — Encounter: Payer: Self-pay | Admitting: Family Medicine

## 2020-02-20 ENCOUNTER — Other Ambulatory Visit: Payer: Self-pay

## 2020-02-20 VITALS — BP 107/64 | HR 82 | Temp 98.1°F | Resp 20 | Ht 70.0 in | Wt 175.0 lb

## 2020-02-20 DIAGNOSIS — R6882 Decreased libido: Secondary | ICD-10-CM | POA: Diagnosis not present

## 2020-02-20 DIAGNOSIS — F411 Generalized anxiety disorder: Secondary | ICD-10-CM

## 2020-02-20 DIAGNOSIS — K219 Gastro-esophageal reflux disease without esophagitis: Secondary | ICD-10-CM

## 2020-02-20 MED ORDER — DULOXETINE HCL 30 MG PO CPEP: 30.0000 mg | ORAL_CAPSULE | Freq: Every day | ORAL | 0 refills | Status: DC

## 2020-02-20 NOTE — Progress Notes (Signed)
Subjective:  Patient ID: Bob Kennedy, male    DOB: June 29, 1980  Age: 39 y.o. MRN: [id number]  CC: 6 week follow up   HPI Bob Kennedy presents for recheck of his anxiety.  He says that the buspirone made him dizzy even when he took 1 a day.  Therefore he discontinued it after just a short time.  He says that a lot of his stress is being caused by his work environment.  There are various situations he describes.  He is retiring in 2 months and he feels that will make a big difference for him.  Depression screen [facility] 2/9 02/20/2020 01/23/2020 05/26/2018  Decreased Interest 0 0 1  Down, Depressed, Hopeless 0 0 1  PHQ - 2 Score 0 0 2  Altered sleeping - - 1  Tired, decreased energy - - 1  Change in appetite - - 1  Feeling bad or failure about yourself  - - 1  Trouble concentrating - - 0  Moving slowly or fidgety/restless - - 1  Suicidal thoughts - - 0  PHQ-9 Score - - 7  Difficult doing work/chores - - Somewhat difficult    History Bob Kennedy has no past medical history on file.   He has no past surgical history on file.   His family history includes Diabetes in his mother; Hypertension in his father.He reports that he quit smoking about 2 years ago. His smoking use included cigarettes. He started smoking about 16 years ago. He smoked 0.50 packs per day. He has never used smokeless tobacco. He reports current alcohol use. He reports that he does not use drugs.    ROS Review of Systems  Constitutional: Negative for fever.  Respiratory: Negative for shortness of breath.   Cardiovascular: Negative for chest pain.  Musculoskeletal: Negative for arthralgias.  Skin: Negative for rash.  Psychiatric/Behavioral: The patient is nervous/anxious.     Objective:  BP 107/64   Pulse 82   Temp 98.1 F (36.7 C) (Temporal)   Resp 20   Ht 5\' 10"  (1.778 m)   Wt 175 lb (79.4 kg)   SpO2 98%   BMI 25.11 kg/m   BP Readings from Last 3 Encounters:  02/20/20 107/64  01/23/20 115/72   12/15/18 101/69    Wt Readings from Last 3 Encounters:  02/20/20 175 lb (79.4 kg)  01/23/20 174 lb 9.6 oz (79.2 kg)  12/15/18 155 lb (70.3 kg)     Physical Exam Vitals reviewed.  Constitutional:      Appearance: He is well-developed.  HENT:     Head: Normocephalic and atraumatic.     Right Ear: External ear normal.     Left Ear: External ear normal.     Mouth/Throat:     Pharynx: No oropharyngeal exudate or posterior oropharyngeal erythema.  Eyes:     Pupils: Pupils are equal, round, and reactive to light.  Cardiovascular:     Rate and Rhythm: Normal rate and regular rhythm.     Heart sounds: No murmur heard.   Pulmonary:     Effort: No respiratory distress.     Breath sounds: Normal breath sounds.  Musculoskeletal:     Cervical back: Normal range of motion and neck supple.  Neurological:     Mental Status: He is alert and oriented to person, place, and time.       Assessment & Plan:   Bob Kennedy was seen today for 6 week follow up.  Diagnoses and all orders for this visit:  Loss  of libido  GAD (generalized anxiety disorder)  Gastroesophageal reflux disease without esophagitis  Other orders -     DULoxetine (CYMBALTA) 30 MG capsule; Take 1 capsule (30 mg total) by mouth daily. For one week then two daily. Take with a full stomach at suppertime       I am having Bob Kennedy start on DULoxetine. I am also having him maintain his busPIRone, pantoprazole, and tadalafil.  Allergies as of 02/20/2020   No Known Allergies     Medication List       Accurate as of February 20, 2020 11:59 PM. If you have any questions, ask your nurse or doctor.        busPIRone 10 MG tablet Commonly known as: BUSPAR Take 1 tablet (10 mg total) by mouth 3 (three) times daily. For anxiety   DULoxetine 30 MG capsule Commonly known as: Cymbalta Take 1 capsule (30 mg total) by mouth daily. For one week then two daily. Take with a full stomach at suppertime Started  by: Mechele Claude, MD   pantoprazole 40 MG tablet Commonly known as: PROTONIX Take 1 tablet (40 mg total) by mouth daily.   tadalafil 5 MG tablet Commonly known as: CIALIS Take 1 tablet (5 mg total) by mouth daily as needed for erectile dysfunction.        Follow-up: No follow-ups on file.  Mechele Claude, M.D.

## 2020-02-29 ENCOUNTER — Encounter: Payer: Self-pay | Admitting: Family Medicine

## 2020-04-02 ENCOUNTER — Ambulatory Visit: Admitting: Family Medicine

## 2020-04-08 ENCOUNTER — Encounter: Payer: Self-pay | Admitting: Family Medicine

## 2020-06-28 IMAGING — DX LUMBAR SPINE - COMPLETE 4+ VIEW
5 series · 5 of 5 positions shown · non-contrast
Comparison: None.

CLINICAL DATA: Chronic right-sided low back pain for 1 year. No
acute injury or prior relevant surgery.

EXAM:
LUMBAR SPINE - COMPLETE 4+ VIEW

[l-spine ap]
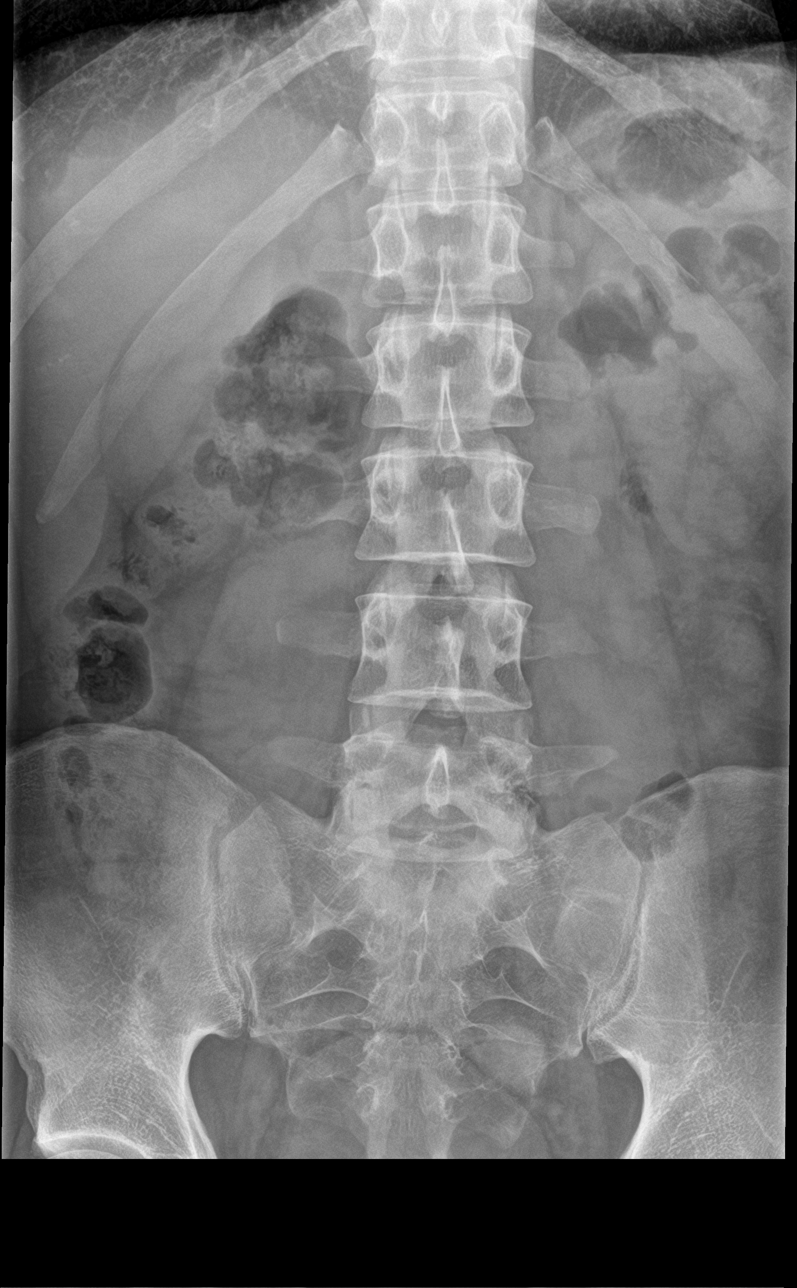

[l-spine obl (1 of 2)]
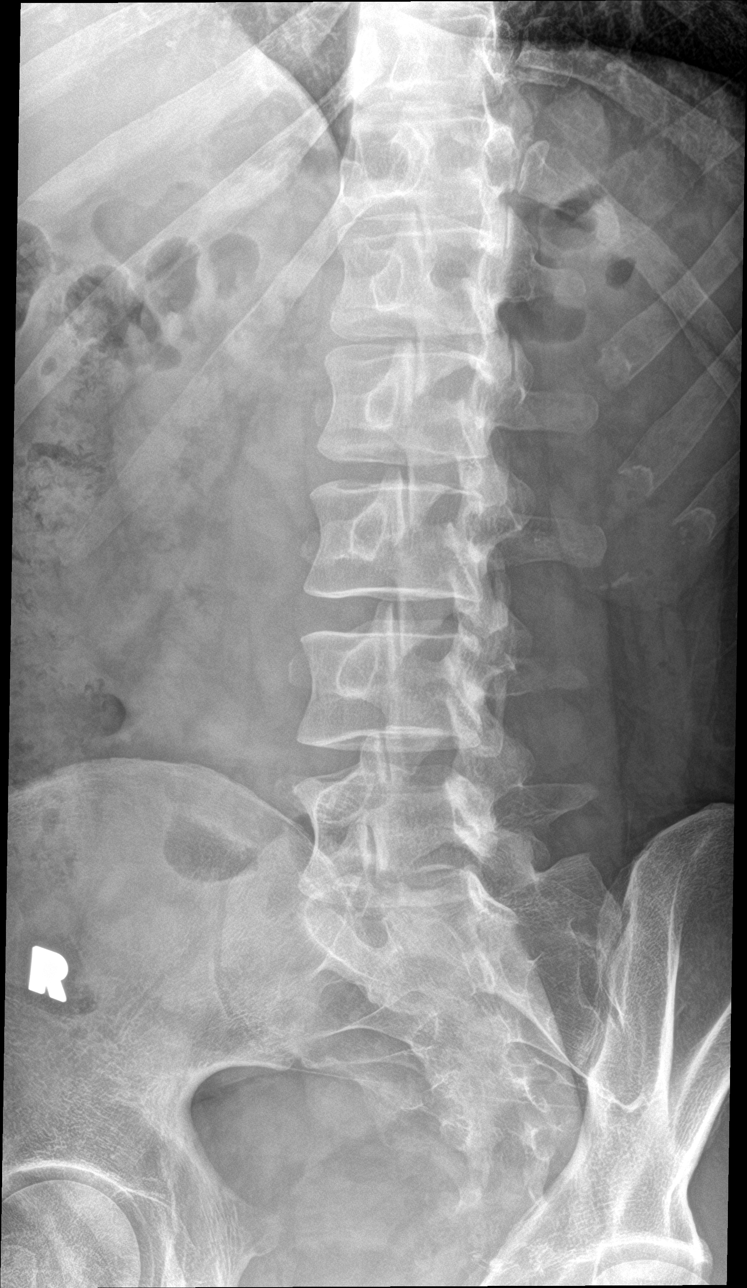

[l-spine obl (2 of 2)]
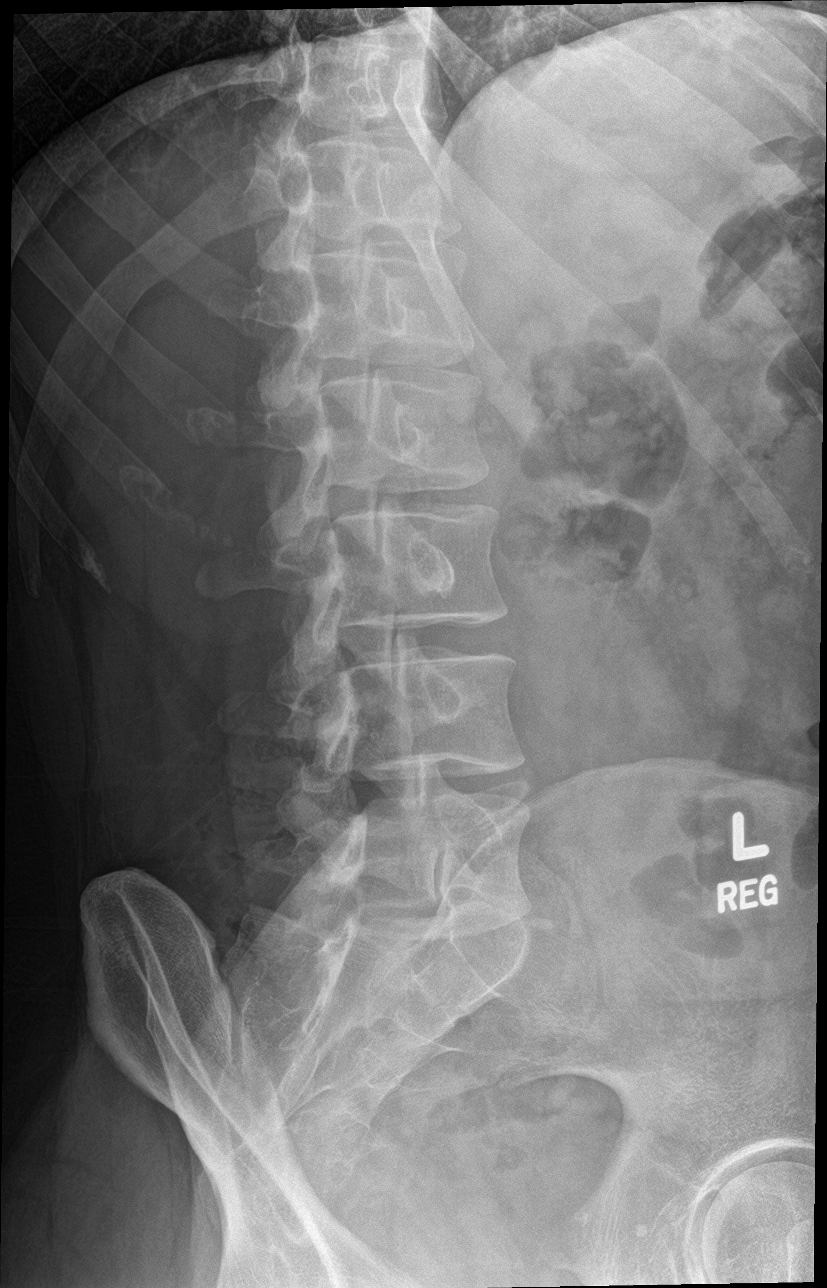

[l-spine lat]
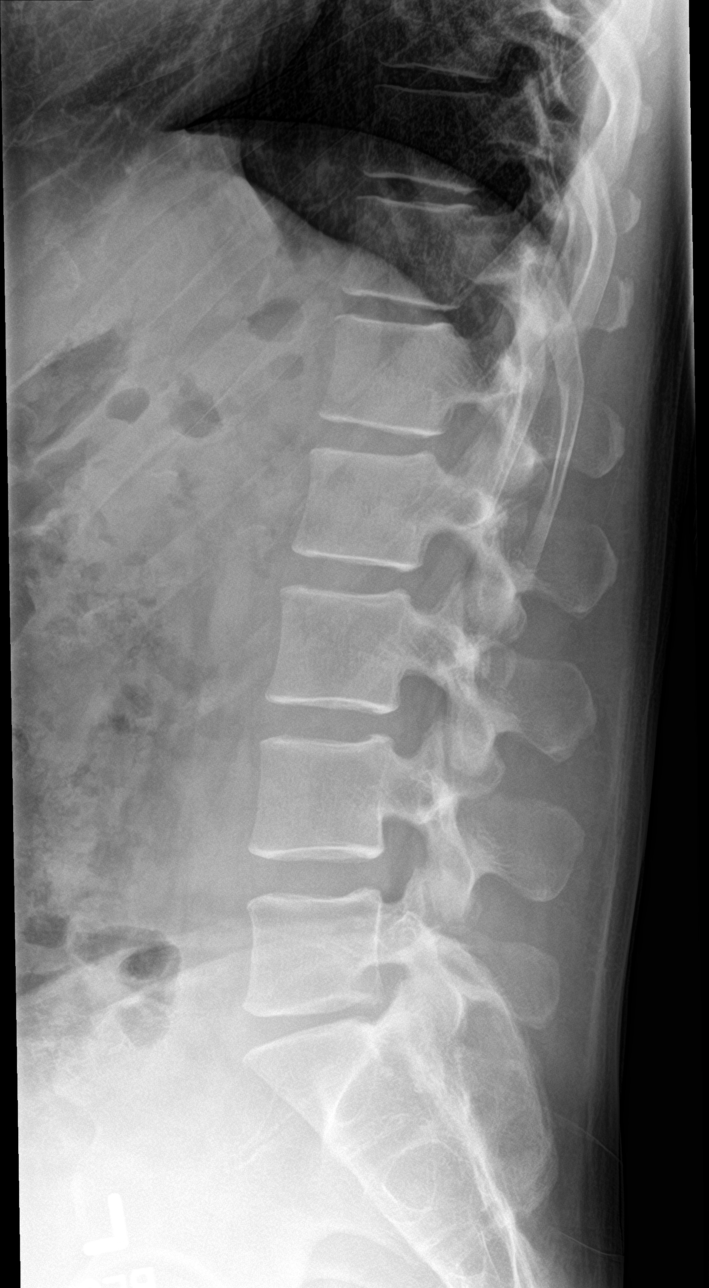

[l-spine spot]
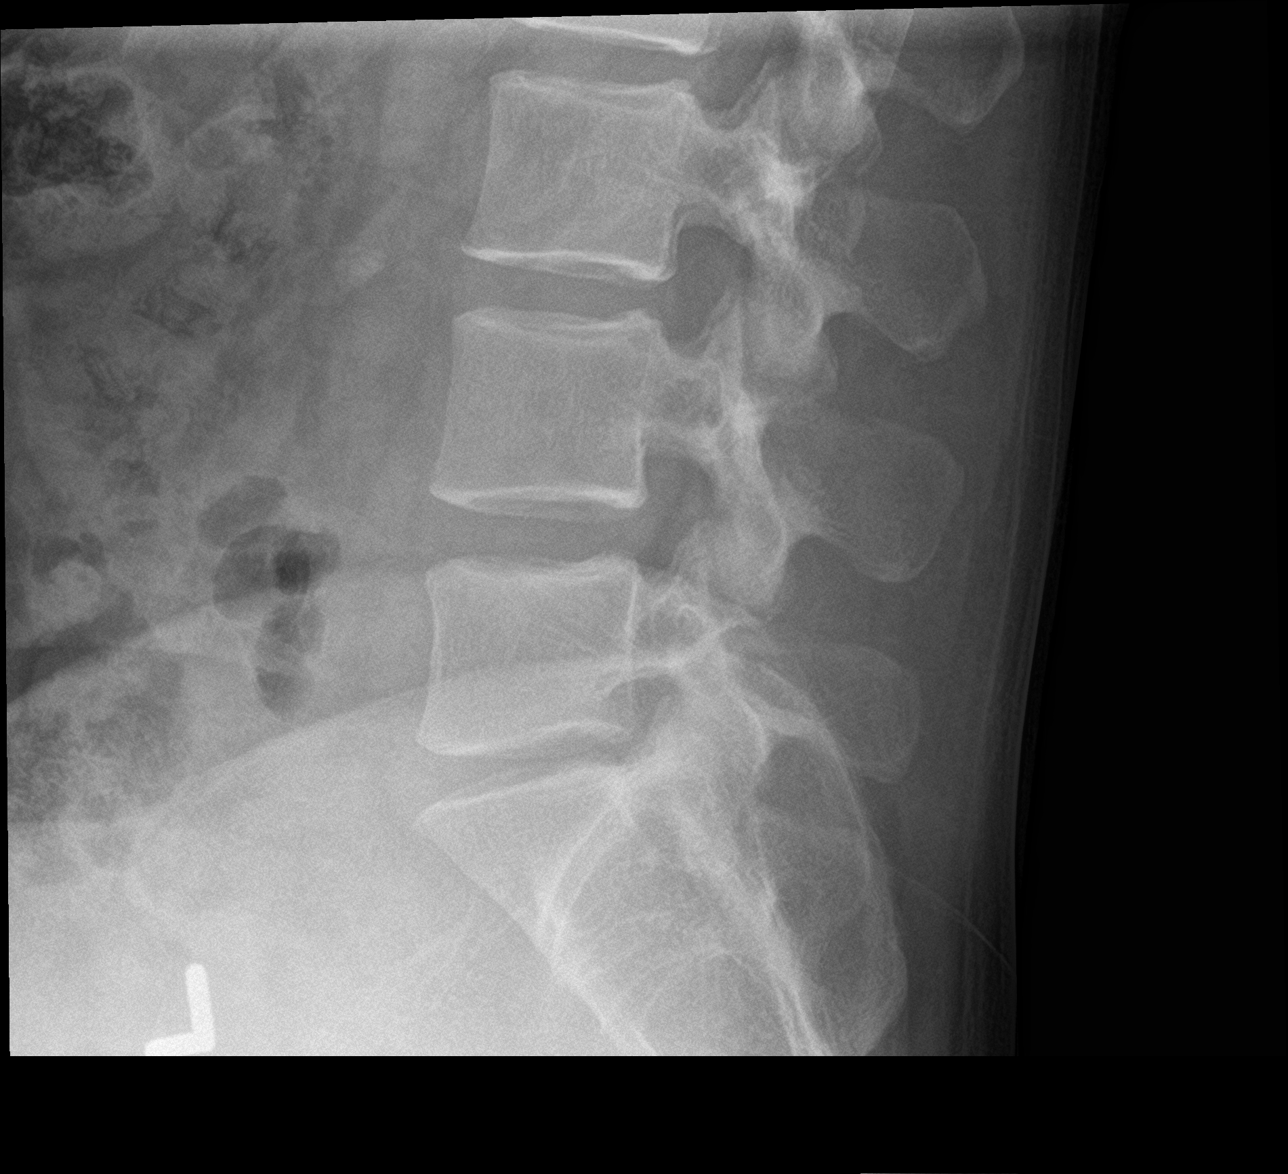

[5 of 5 positions shown; findings below may reference images not displayed]

FINDINGS: There are 5 lumbar type vertebral bodies. There is mild disc space
narrowing at L5-S1 and 3 mm of retrolisthesis. The additional disc
spaces are preserved. There is no evidence of acute fracture or pars
defect.
IMPRESSION: Mild degenerative disc disease at L5-S1 with grade 1 retrolisthesis.
No acute osseous findings.

## 2020-12-17 ENCOUNTER — Other Ambulatory Visit: Payer: Self-pay

## 2020-12-17 ENCOUNTER — Ambulatory Visit (INDEPENDENT_AMBULATORY_CARE_PROVIDER_SITE_OTHER): Admitting: Family Medicine

## 2020-12-17 ENCOUNTER — Encounter: Payer: Self-pay | Admitting: Family Medicine

## 2020-12-17 VITALS — BP 107/66 | HR 83 | Temp 97.9°F | Ht 70.0 in | Wt 171.4 lb

## 2020-12-17 DIAGNOSIS — F411 Generalized anxiety disorder: Secondary | ICD-10-CM | POA: Diagnosis not present

## 2020-12-17 DIAGNOSIS — R6882 Decreased libido: Secondary | ICD-10-CM | POA: Diagnosis not present

## 2020-12-17 MED ORDER — DESVENLAFAXINE SUCCINATE ER 50 MG PO TB24: 50.0000 mg | ORAL_TABLET | Freq: Every day | ORAL | 3 refills | Status: DC

## 2020-12-17 MED ORDER — QUETIAPINE FUMARATE 25 MG PO TABS: 25.0000 mg | ORAL_TABLET | Freq: Every day | ORAL | 1 refills | Status: DC

## 2020-12-17 NOTE — Progress Notes (Signed)
Subjective:  Patient ID: Bob Kennedy, male    DOB: Jun 21, 1980  Age: 40 y.o. MRN: [id number]  CC: check up   HPI Bob Kennedy presents for work tasks destroying his life. 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 Therapist feels he has PTSD. Meds weren't working so he DCed.   Feels he is in a never ending cycle of imminent failure. Away from family a lot. Considering medical discharge. Has a toxic relationship with kids as a result. Getting only 4 hours of sleep per night. Early awakening.   Depression screen [facility] 2/9 12/17/2020 12/17/2020 02/20/2020  Decreased Interest 2 0 0  Down, Depressed, Hopeless 3 0 0  PHQ - 2 Score 5 0 0  Altered sleeping 3 - -  Tired, decreased energy 3 - -  Change in appetite 3 - -  Feeling bad or failure about yourself  2 - -  Trouble concentrating 3 - -  Moving slowly or fidgety/restless 3 - -  Suicidal thoughts 0 - -  PHQ-9 Score 22 - -  Difficult doing work/chores Extremely dIfficult - -   GAD 7 : Generalized Anxiety Score 12/17/2020  Nervous, Anxious, on Edge 3  Control/stop worrying 3  Worry too much - different things 3  Trouble relaxing 3  Restless 3  Easily annoyed or irritable 3  Afraid - awful might happen 3  Total GAD 7 Score 21  Anxiety Difficulty Extremely difficult      History Bob Kennedy has no past medical history on file.   He has no past surgical history on file.   His family history includes Diabetes in his mother; Hypertension in his father.He reports that he quit smoking about 3 years ago. His smoking use included cigarettes. He started smoking about 17 years ago. He smoked an average of .5 packs per day. He has never used smokeless tobacco. He reports current alcohol use. He reports that he does not use drugs.    ROS Review of Systems  Constitutional:  Negative for fever.  HENT: Negative.    Respiratory:  Negative for shortness of  breath.   Cardiovascular:  Negative for chest pain.  Musculoskeletal:  Negative for arthralgias.  Skin:  Negative for rash.  Psychiatric/Behavioral:  Positive for dysphoric mood and sleep disturbance. The patient is nervous/anxious.    Objective:  BP 107/66   Pulse 83   Temp 97.9 F (36.6 C)   Ht 5\' 10"  (1.778 m)   Wt 171 lb 6.4 oz (77.7 kg)   SpO2 98%   BMI 24.59 kg/m   BP Readings from Last 3 Encounters:  12/17/20 107/66  02/20/20 107/64  01/23/20 115/72    Wt Readings from Last 3 Encounters:  12/17/20 171 lb 6.4 oz (77.7 kg)  02/20/20 175 lb (79.4 kg)  01/23/20 174 lb 9.6 oz (79.2 kg)     Physical Exam Vitals reviewed.  Constitutional:      Appearance: He is well-developed.  HENT:     Head: Normocephalic and atraumatic.     Right Ear: External ear normal.     Left Ear: External ear normal.     Mouth/Throat:     Pharynx: No oropharyngeal exudate or posterior oropharyngeal erythema.  Eyes:     Pupils: Pupils are equal, round, and reactive to light.  Cardiovascular:     Rate and Rhythm: Normal rate and regular rhythm.     Heart sounds: No murmur heard.  Pulmonary:     Effort: No respiratory distress.     Breath sounds: Normal breath sounds.  Musculoskeletal:     Cervical back: Normal range of motion and neck supple.  Neurological:     Mental Status: He is alert and oriented to person, place, and time.      Assessment & Plan:   Bob Kennedy was seen today for check up.  Diagnoses and all orders for this visit:  GAD (generalized anxiety disorder)  Loss of libido  Other orders -     desvenlafaxine (PRISTIQ) 50 MG 24 hr tablet; Take 1 tablet (50 mg total) by mouth daily. -     QUEtiapine (SEROQUEL) 25 MG tablet; Take 1 tablet (25 mg total) by mouth at bedtime.      I have discontinued Bob Kennedy's busPIRone, pantoprazole, tadalafil, and DULoxetine. I am also having him start on desvenlafaxine and QUEtiapine.  Allergies as of 12/17/2020   No  Known Allergies      Medication List        Accurate as of December 17, 2020 11:59 PM. If you have any questions, ask your nurse or doctor.          STOP taking these medications    busPIRone 10 MG tablet Commonly known as: BUSPAR Stopped by: Mechele Claude, MD   DULoxetine 30 MG capsule Commonly known as: Cymbalta Stopped by: Mechele Claude, MD   pantoprazole 40 MG tablet Commonly known as: PROTONIX Stopped by: Mechele Claude, MD   tadalafil 5 MG tablet Commonly known as: CIALIS Stopped by: Mechele Claude, MD       TAKE these medications    desvenlafaxine 50 MG 24 hr tablet Commonly known as: PRISTIQ Take 1 tablet (50 mg total) by mouth daily. Started by: Mechele Claude, MD   QUEtiapine 25 MG tablet Commonly known as: SEROQUEL Take 1 tablet (25 mg total) by mouth at bedtime. Started by: Mechele Claude, MD         Follow-up: Return in about 1 month (around 01/17/2021).  Mechele Claude, M.D.

## 2020-12-19 ENCOUNTER — Encounter: Payer: Self-pay | Admitting: Family Medicine

## 2021-01-20 ENCOUNTER — Encounter: Payer: Self-pay | Admitting: Family Medicine

## 2021-01-20 ENCOUNTER — Ambulatory Visit (INDEPENDENT_AMBULATORY_CARE_PROVIDER_SITE_OTHER): Admitting: Family Medicine

## 2021-01-20 ENCOUNTER — Other Ambulatory Visit: Payer: Self-pay

## 2021-01-20 VITALS — BP 115/77 | HR 97 | Temp 97.9°F | Ht 70.0 in | Wt 171.2 lb

## 2021-01-20 DIAGNOSIS — F339 Major depressive disorder, recurrent, unspecified: Secondary | ICD-10-CM

## 2021-01-20 DIAGNOSIS — N521 Erectile dysfunction due to diseases classified elsewhere: Secondary | ICD-10-CM | POA: Diagnosis not present

## 2021-01-20 DIAGNOSIS — F411 Generalized anxiety disorder: Secondary | ICD-10-CM

## 2021-01-20 DIAGNOSIS — M5136 Other intervertebral disc degeneration, lumbar region: Secondary | ICD-10-CM

## 2021-01-20 MED ORDER — DESVENLAFAXINE SUCCINATE ER 100 MG PO TB24: 100.0000 mg | ORAL_TABLET | Freq: Every day | ORAL | 2 refills | Status: DC

## 2021-01-20 MED ORDER — SILDENAFIL CITRATE 20 MG PO TABS: ORAL_TABLET | ORAL | 5 refills | Status: AC

## 2021-01-20 NOTE — Patient Instructions (Signed)

## 2021-01-20 NOTE — Progress Notes (Signed)
Subjective:  Patient ID: Bob Kennedy, male    DOB: Jul 06, 1980  Age: 40 y.o. MRN: [id number]  CC: Anxiety   HPI Kayhan Boardley presents for recheck of anxiety and depression. Work problems with seeking help for depression. As a result of treatment he miissed a training event. Feels that he is expected to be everywhere at once. He is applying for jobs, but getting multiple rejections. Daughter had to go to summer school.  Meds make him drowsy. Feeling less worried but more angry. Has a counselor he is seeing. He is a Chief of Staff. E-6.   Depression screen [facility] 2/9 01/20/2021 12/17/2020 12/17/2020  Decreased Interest 3 2 0  Down, Depressed, Hopeless 3 3 0  PHQ - 2 Score 6 5 0  Altered sleeping 3 3 -  Tired, decreased energy 3 3 -  Change in appetite 3 3 -  Feeling bad or failure about yourself  3 2 -  Trouble concentrating 3 3 -  Moving slowly or fidgety/restless 3 3 -  Suicidal thoughts 0 0 -  PHQ-9 Score 24 22 -  Difficult doing work/chores Extremely dIfficult Extremely dIfficult -    History Partick has no past medical history on file.   He has no past surgical history on file.   His family history includes Diabetes in his mother; Hypertension in his father.He reports that he quit smoking about 3 years ago. His smoking use included cigarettes. He started smoking about 17 years ago. He smoked an average of .5 packs per day. He has never used smokeless tobacco. He reports current alcohol use. He reports that he does not use drugs.    ROS Review of Systems  Constitutional:  Negative for fever.  Respiratory:  Negative for shortness of breath.   Cardiovascular:  Negative for chest pain.  Musculoskeletal:  Positive for back pain (lower lumbar. XR showed degenerative disc Dx). Negative for arthralgias.  Skin:  Negative for rash.   Objective:  BP 115/77   Pulse 97   Temp 97.9 F (36.6 C)   Ht 5\' 10"  (1.778 m)   Wt 171 lb 3.2 oz (77.7 kg)   SpO2 96%   BMI 24.56  kg/m   BP Readings from Last 3 Encounters:  01/20/21 115/77  12/17/20 107/66  02/20/20 107/64    Wt Readings from Last 3 Encounters:  01/20/21 171 lb 3.2 oz (77.7 kg)  12/17/20 171 lb 6.4 oz (77.7 kg)  02/20/20 175 lb (79.4 kg)     Physical Exam Vitals reviewed.  Constitutional:      Appearance: He is well-developed.  HENT:     Head: Normocephalic and atraumatic.     Right Ear: External ear normal.     Left Ear: External ear normal.     Mouth/Throat:     Pharynx: No oropharyngeal exudate or posterior oropharyngeal erythema.  Eyes:     Pupils: Pupils are equal, round, and reactive to light.  Cardiovascular:     Rate and Rhythm: Normal rate and regular rhythm.     Heart sounds: No murmur heard. Pulmonary:     Effort: No respiratory distress.     Breath sounds: Normal breath sounds.  Musculoskeletal:     Cervical back: Normal range of motion and neck supple.  Neurological:     Mental Status: He is alert and oriented to person, place, and time.      Assessment & Plan:   Kylan was seen today for anxiety.  Diagnoses and all orders for this  visit:  GAD (generalized anxiety disorder)  Erectile dysfunction due to diseases classified elsewhere  Depression, recurrent (HCC)  DDD (degenerative disc disease), lumbar  Other orders -     sildenafil (REVATIO) 20 MG tablet; Take 2-5 pills at once, orally, with each sexual encounter -     desvenlafaxine (PRISTIQ) 100 MG 24 hr tablet; Take 1 tablet (100 mg total) by mouth daily.      I have changed Beauregard Galen's desvenlafaxine. I am also having him start on sildenafil. Additionally, I am having him maintain his QUEtiapine.  Allergies as of 01/20/2021   No Known Allergies      Medication List        Accurate as of January 20, 2021  6:01 PM. If you have any questions, ask your nurse or doctor.          desvenlafaxine 100 MG 24 hr tablet Commonly known as: PRISTIQ Take 1 tablet (100 mg total) by  mouth daily. What changed:  medication strength how much to take Changed by: Mechele Claude, MD   QUEtiapine 25 MG tablet Commonly known as: SEROQUEL Take 1 tablet (25 mg total) by mouth at bedtime.   sildenafil 20 MG tablet Commonly known as: REVATIO Take 2-5 pills at once, orally, with each sexual encounter Started by: Mechele Claude, MD         Follow-up: Return in about 1 month (around 02/19/2021).  Mechele Claude, M.D.

## 2021-02-11 ENCOUNTER — Other Ambulatory Visit: Payer: Self-pay | Admitting: Family Medicine

## 2021-02-24 ENCOUNTER — Ambulatory Visit: Admitting: Family Medicine

## 2021-02-26 ENCOUNTER — Ambulatory Visit (INDEPENDENT_AMBULATORY_CARE_PROVIDER_SITE_OTHER): Admitting: Family Medicine

## 2021-02-26 ENCOUNTER — Encounter: Payer: Self-pay | Admitting: Family Medicine

## 2021-02-26 ENCOUNTER — Other Ambulatory Visit: Payer: Self-pay

## 2021-02-26 VITALS — BP 126/67 | HR 97 | Temp 98.1°F | Ht 70.0 in | Wt 171.0 lb

## 2021-02-26 DIAGNOSIS — F339 Major depressive disorder, recurrent, unspecified: Secondary | ICD-10-CM | POA: Diagnosis not present

## 2021-02-26 DIAGNOSIS — F411 Generalized anxiety disorder: Secondary | ICD-10-CM

## 2021-02-26 MED ORDER — QUETIAPINE FUMARATE 100 MG PO TABS: 100.0000 mg | ORAL_TABLET | Freq: Every day | ORAL | 2 refills | Status: DC

## 2021-02-26 MED ORDER — QUETIAPINE FUMARATE 25 MG PO TABS: ORAL_TABLET | ORAL | 1 refills | Status: DC

## 2021-02-26 NOTE — Progress Notes (Signed)
Subjective:  Patient ID: Bob Kennedy, male    DOB: Sep 04, 1980  Age: 40 y.o. MRN: [id number]  CC: Anxiety   HPI Bob Kennedy presents for recheck of anxiety/ depression. 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 Looking for a job. Thinks Bob Kennedy has one. Worried that Bob Kennedy is being punished by his unit for seeking help.  Has been pulled so many ways with the duty assignments that Bob Kennedy has decompensated. Reached out for help and suffering retributions. Going for counseling weekly. Not sleeping right. Feels helpless in the situation.   Taking meds as prescribed. Denies side effects . Says chest feels weird though. "Relaxed."  Depression screen [facility] 2/9 02/26/2021 02/26/2021 01/20/2021  Decreased Interest 2 2 3   Down, Depressed, Hopeless 3 3 3   PHQ - 2 Score 5 5 6   Altered sleeping 3 - 3  Tired, decreased energy 3 - 3  Change in appetite 2 - 3  Feeling bad or failure about yourself  3 - 3  Trouble concentrating 1 - 3  Moving slowly or fidgety/restless 2 - 3  Suicidal thoughts 0 - 0  PHQ-9 Score 19 - 24  Difficult doing work/chores Very difficult - Extremely dIfficult   GAD 7 : Generalized Anxiety Score 02/26/2021 01/20/2021 12/17/2020  Nervous, Anxious, on Edge 3 3 3   Control/stop worrying 3 3 3   Worry too much - different things 3 3 3   Trouble relaxing 3 3 3   Restless 2 3 3   Easily annoyed or irritable 3 3 3   Afraid - awful might happen 3 3 3   Total GAD 7 Score 20 21 21   Anxiety Difficulty Very difficult Extremely difficult Extremely difficult     History Bob Kennedy has no past medical history on file.   Bob Kennedy has no past surgical history on file.   His family history includes Diabetes in his mother; Hypertension in his father.Bob Kennedy reports that Bob Kennedy quit smoking about 3 years ago. His smoking use included cigarettes. Bob Kennedy started smoking about 17 years ago. Bob Kennedy smoked an average of .5 packs per day. Bob Kennedy has never used smokeless tobacco. Bob Kennedy reports current alcohol use. Bob Kennedy reports  that Bob Kennedy does not use drugs.    ROS Review of Systems  Constitutional:  Negative for fever.  Respiratory:  Negative for shortness of breath.   Cardiovascular:  Negative for chest pain.  Musculoskeletal:  Negative for arthralgias.  Skin:  Negative for rash.  Psychiatric/Behavioral:  Positive for agitation and dysphoric mood. The patient is nervous/anxious.    Objective:  BP 126/67   Pulse 97   Temp 98.1 F (36.7 C)   Ht 5\' 10"  (1.778 m)   Wt 171 lb (77.6 kg)   SpO2 (!) 89%   BMI 24.54 kg/m   BP Readings from Last 3 Encounters:  02/26/21 126/67  01/20/21 115/77  12/17/20 107/66    Wt Readings from Last 3 Encounters:  02/26/21 171 lb (77.6 kg)  01/20/21 171 lb 3.2 oz (77.7 kg)  12/17/20 171 lb 6.4 oz (77.7 kg)     Physical Exam Vitals reviewed.  Constitutional:      General: Bob Kennedy is in acute distress (emotional).     Appearance: Bob Kennedy is well-developed.  HENT:     Head: Normocephalic and atraumatic.     Right Ear: External ear normal.     Left Ear: External ear normal.     Mouth/Throat:     Pharynx: No oropharyngeal exudate or posterior oropharyngeal erythema.  Eyes:     Pupils: Pupils are  equal, round, and reactive to light.  Cardiovascular:     Rate and Rhythm: Normal rate and regular rhythm.     Heart sounds: No murmur heard. Pulmonary:     Effort: No respiratory distress.     Breath sounds: Normal breath sounds.  Musculoskeletal:     Cervical back: Normal range of motion and neck supple.  Neurological:     Mental Status: Bob Kennedy is alert and oriented to person, place, and time.  Psychiatric:        Attention and Perception: Attention normal.        Mood and Affect: Mood is depressed. Affect is labile.        Speech: Speech normal.        Behavior: Behavior is agitated.        Judgment: Judgment is impulsive.      Assessment & Plan:   Bob Kennedy was seen today for anxiety.  Diagnoses and all orders for this visit:  GAD (generalized anxiety  disorder)  Depression, recurrent (HCC)  Other orders -     QUEtiapine (SEROQUEL) 25 MG tablet; Two daily for one week, then three daily for a week. Then switch to the stronger pill -     QUEtiapine (SEROQUEL) 100 MG tablet; Take 1 tablet (100 mg total) by mouth at bedtime.      I have changed Bob Kennedy's QUEtiapine. I am also having Bob Kennedy start on QUEtiapine. Additionally, I am having Bob Kennedy maintain his sildenafil and desvenlafaxine.  Allergies as of 02/26/2021   No Known Allergies      Medication List        Accurate as of February 26, 2021 11:59 PM. If you have any questions, ask your nurse or doctor.          desvenlafaxine 100 MG 24 hr tablet Commonly known as: PRISTIQ TAKE 1 TABLET BY MOUTH EVERY DAY   QUEtiapine 25 MG tablet Commonly known as: SEROQUEL Two daily for one week, then three daily for a week. Then switch to the stronger pill What changed:  how much to take how to take this when to take this additional instructions Changed by: Mechele Claude, MD   QUEtiapine 100 MG tablet Commonly known as: SEROquel Take 1 tablet (100 mg total) by mouth at bedtime. What changed: You were already taking a medication with the same name, and this prescription was added. Make sure you understand how and when to take each. Changed by: Mechele Claude, MD   sildenafil 20 MG tablet Commonly known as: REVATIO Take 2-5 pills at once, orally, with each sexual encounter         Follow-up: Return in about 1 month (around 03/29/2021).  Mechele Claude, M.D.

## 2021-03-03 ENCOUNTER — Telehealth: Payer: Self-pay | Admitting: Family Medicine

## 2021-03-03 MED ORDER — QUETIAPINE FUMARATE 25 MG PO TABS: ORAL_TABLET | ORAL | 1 refills | Status: DC

## 2021-03-03 MED ORDER — QUETIAPINE FUMARATE 100 MG PO TABS: 100.0000 mg | ORAL_TABLET | Freq: Every day | ORAL | 2 refills | Status: DC

## 2021-03-03 NOTE — Telephone Encounter (Signed)
Sent to correct pharm 

## 2021-03-04 ENCOUNTER — Other Ambulatory Visit: Payer: Self-pay | Admitting: Family Medicine

## 2021-03-18 ENCOUNTER — Encounter: Payer: Self-pay | Admitting: *Deleted

## 2021-03-18 ENCOUNTER — Telehealth: Payer: Self-pay | Admitting: Family Medicine

## 2021-03-18 NOTE — Telephone Encounter (Signed)
Yes please

## 2021-03-18 NOTE — Telephone Encounter (Signed)
Okay for letter

## 2021-03-18 NOTE — Telephone Encounter (Signed)
Done and patient aware.

## 2021-03-27 ENCOUNTER — Other Ambulatory Visit: Payer: Self-pay | Admitting: Family Medicine

## 2021-04-02 ENCOUNTER — Ambulatory Visit (INDEPENDENT_AMBULATORY_CARE_PROVIDER_SITE_OTHER): Admitting: Family Medicine

## 2021-04-02 ENCOUNTER — Other Ambulatory Visit: Payer: Self-pay

## 2021-04-02 ENCOUNTER — Encounter: Payer: Self-pay | Admitting: Family Medicine

## 2021-04-02 VITALS — BP 127/73 | HR 103 | Temp 97.0°F | Resp 20 | Ht 70.0 in | Wt 177.0 lb

## 2021-04-02 DIAGNOSIS — F339 Major depressive disorder, recurrent, unspecified: Secondary | ICD-10-CM

## 2021-04-02 DIAGNOSIS — F411 Generalized anxiety disorder: Secondary | ICD-10-CM | POA: Diagnosis not present

## 2021-04-02 MED ORDER — QUETIAPINE FUMARATE 150 MG PO TABS: 150.0000 mg | ORAL_TABLET | Freq: Every day | ORAL | 2 refills | Status: AC

## 2021-04-02 NOTE — Progress Notes (Signed)
Subjective:  Patient ID: Bob Kennedy, male    DOB: 1981/04/19  Age: 40 y.o. MRN: [id number]  CC: Follow up anxiety/depression   HPI Bob Kennedy presents for recheck of depression and anxiety. Still dealing with [employer]. Requesting medical separation. 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  Depression screen [facility] 2/9 04/02/2021 02/26/2021 02/26/2021  Decreased Interest 2 2 2   Down, Depressed, Hopeless 2 3 3   PHQ - 2 Score 4 5 5   Altered sleeping 2 3 -  Tired, decreased energy 2 3 -  Change in appetite 2 2 -  Feeling bad or failure about yourself  2 3 -  Trouble concentrating 3 1 -  Moving slowly or fidgety/restless 2 2 -  Suicidal thoughts 1 0 -  PHQ-9 Score 18 19 -  Difficult doing work/chores Extremely dIfficult Very difficult -   GAD 7 : Generalized Anxiety Score 04/02/2021 02/26/2021 01/20/2021 12/17/2020  Nervous, Anxious, on Edge 3 3 3 3   Control/stop worrying 3 3 3 3   Worry too much - different things 3 3 3 3   Trouble relaxing 2 3 3 3   Restless 3 2 3 3   Easily annoyed or irritable 3 3 3 3   Afraid - awful might happen 3 3 3 3   Total GAD 7 Score 20 20 21 21   Anxiety Difficulty Extremely difficult Very difficult Extremely difficult Extremely difficult     History Lakai has no past medical history on file.   He has no past surgical history on file.   His family history includes Diabetes in his mother; Hypertension in his father.He reports that he quit smoking about 3 years ago. His smoking use included cigarettes. He started smoking about 17 years ago. He smoked an average of .5 packs per day. He has never used smokeless tobacco. He reports current alcohol use. He reports that he does not use drugs.    ROS Review of Systems  Constitutional:  Negative for fever.  Respiratory:  Negative for shortness of breath.   Cardiovascular:  Negative for chest pain.  Musculoskeletal:   Negative for arthralgias.  Skin:  Negative for rash.   Objective:  BP 127/73   Pulse (!) 103   Temp (!) 97 F (36.1 C) (Temporal)   Resp 20   Ht 5\' 10"  (1.778 m)   Wt 177 lb (80.3 kg)   SpO2 95%   BMI 25.40 kg/m   BP Readings from Last 3 Encounters:  04/02/21 127/73  02/26/21 126/67  01/20/21 115/77    Wt Readings from Last 3 Encounters:  04/02/21 177 lb (80.3 kg)  02/26/21 171 lb (77.6 kg)  01/20/21 171 lb 3.2 oz (77.7 kg)     Physical Exam Vitals reviewed.  Constitutional:      Appearance: He is well-developed.  HENT:     Head: Normocephalic and atraumatic.     Right Ear: External ear normal.     Left Ear: External ear normal.     Mouth/Throat:     Pharynx: No oropharyngeal exudate or posterior oropharyngeal erythema.  Eyes:     Pupils: Pupils are equal, round, and reactive to light.  Cardiovascular:     Rate and Rhythm: Normal rate and regular rhythm.     Heart sounds: No murmur heard. Pulmonary:     Effort: No respiratory distress.     Breath sounds: Normal breath sounds.  Musculoskeletal:     Cervical back: Normal range of  motion and neck supple.  Neurological:     Mental Status: He is alert and oriented to person, place, and time.      Assessment & Plan:   Antionne was seen today for follow up anxiety/depression.  Diagnoses and all orders for this visit:  GAD (generalized anxiety disorder)  Depression, recurrent (HCC)  Other orders -     QUEtiapine 150 MG TABS; Take 150 mg by mouth at bedtime.      I have discontinued Keiyon Clugston's QUEtiapine. I have also changed his QUEtiapine to QUEtiapine Fumarate. Additionally, I am having him maintain his sildenafil and desvenlafaxine.  Allergies as of 04/02/2021   No Known Allergies      Medication List        Accurate as of April 02, 2021  4:33 PM. If you have any questions, ask your nurse or doctor.          desvenlafaxine 100 MG 24 hr tablet Commonly known as:  PRISTIQ TAKE 1 TABLET BY MOUTH EVERY DAY   QUEtiapine Fumarate 150 MG Tabs Take 150 mg by mouth at bedtime. What changed:  medication strength See the new instructions. Another medication with the same name was removed. Continue taking this medication, and follow the directions you see here. Changed by: Mechele Claude, MD   sildenafil 20 MG tablet Commonly known as: REVATIO Take 2-5 pills at once, orally, with each sexual encounter         Follow-up: Return in about 1 month (around 05/02/2021).  Mechele Claude, M.D.

## 2021-05-06 ENCOUNTER — Telehealth: Payer: Self-pay | Admitting: Family Medicine

## 2021-05-06 ENCOUNTER — Ambulatory Visit: Admitting: Family Medicine

## 2021-05-06 NOTE — Telephone Encounter (Signed)
Called pt, he was angry and upset and ONLY wants to speak to Dr. Darlyn Read

## 2021-05-06 NOTE — Telephone Encounter (Signed)
Patient called in at 5:00 to see when Dr. Darlyn Read will return his call.  He was very angry again with our office.  Is adamant that he receive a phone call from Dr. Darlyn Read.

## 2021-05-08 ENCOUNTER — Other Ambulatory Visit: Payer: Self-pay | Admitting: Family Medicine

## 2021-05-29 ENCOUNTER — Encounter: Payer: Self-pay | Admitting: Family Medicine

## 2021-06-15 ENCOUNTER — Other Ambulatory Visit: Payer: Self-pay | Admitting: Family Medicine

## 2021-06-26 ENCOUNTER — Other Ambulatory Visit: Payer: Self-pay | Admitting: Family Medicine

## 2021-08-03 ENCOUNTER — Other Ambulatory Visit: Payer: Self-pay | Admitting: Family Medicine

## 2021-08-25 ENCOUNTER — Other Ambulatory Visit: Payer: Self-pay | Admitting: Family Medicine

## 2021-08-29 ENCOUNTER — Other Ambulatory Visit: Payer: Self-pay | Admitting: Family Medicine

## 2021-09-10 ENCOUNTER — Other Ambulatory Visit: Payer: Self-pay | Admitting: Family Medicine
